# Patient Record
Sex: Female | Born: 1950 | Race: White | Hispanic: No | Smoking: Former smoker
Health system: Southern US, Community
[De-identification: ages and names within clinical notes are randomized; demographics above are authoritative.]

## PROBLEM LIST (undated history)

## (undated) DIAGNOSIS — S322XXA Fracture of coccyx, initial encounter for closed fracture: Secondary | ICD-10-CM

## (undated) DIAGNOSIS — F419 Anxiety disorder, unspecified: Secondary | ICD-10-CM

## (undated) DIAGNOSIS — IMO0002 Reserved for concepts with insufficient information to code with codable children: Secondary | ICD-10-CM

## (undated) DIAGNOSIS — J449 Chronic obstructive pulmonary disease, unspecified: Secondary | ICD-10-CM

## (undated) DIAGNOSIS — I10 Essential (primary) hypertension: Secondary | ICD-10-CM

## (undated) DIAGNOSIS — J45909 Unspecified asthma, uncomplicated: Secondary | ICD-10-CM

## (undated) DIAGNOSIS — G4730 Sleep apnea, unspecified: Secondary | ICD-10-CM

## (undated) DIAGNOSIS — E119 Type 2 diabetes mellitus without complications: Secondary | ICD-10-CM

## (undated) DIAGNOSIS — R0602 Shortness of breath: Secondary | ICD-10-CM

## (undated) HISTORY — PX: ABDOMINAL HYSTERECTOMY: SHX81

## (undated) HISTORY — PX: SKIN CANCER EXCISION: SHX779

---

## 2012-12-23 DIAGNOSIS — S322XXA Fracture of coccyx, initial encounter for closed fracture: Secondary | ICD-10-CM

## 2012-12-23 HISTORY — DX: Fracture of coccyx, initial encounter for closed fracture: S32.2XXA

## 2013-08-31 ENCOUNTER — Encounter (HOSPITAL_COMMUNITY): Payer: Self-pay | Admitting: Pharmacy Technician

## 2013-09-01 NOTE — H&P (Signed)
HISTORY AND PHYSICAL  LORRENA GORANSON is a 62 y.o. female patient with CC: Unable to eat and chew. Painful teeth  No diagnosis found.  No past medical history on file.  No current facility-administered medications for this encounter.   Current Outpatient Prescriptions  Medication Sig Dispense Refill  . amLODipine (NORVASC) 10 MG tablet Take 10 mg by mouth daily.      Marland Kitchen aspirin EC 81 MG tablet Take 81 mg by mouth daily.      Marland Kitchen gabapentin (NEURONTIN) 600 MG tablet Take 600-1,200 mg by mouth 3 (three) times daily. 600 mg every morning and midday, then 1200 mg at night      . hydrochlorothiazide (HYDRODIURIL) 25 MG tablet Take 25 mg by mouth daily.      . insulin glargine (LANTUS) 100 UNIT/ML injection Inject 78 Units into the skin at bedtime.      Marland Kitchen ketoconazole (NIZORAL) 2 % cream Apply 1 application topically daily as needed for irritation. Applied to face      . losartan (COZAAR) 50 MG tablet Take 50 mg by mouth daily.      . metFORMIN (GLUCOPHAGE) 1000 MG tablet Take 1,000 mg by mouth 2 (two) times daily with a meal.      . PARoxetine (PAXIL) 20 MG tablet Take 20 mg by mouth daily.      . pravastatin (PRAVACHOL) 40 MG tablet Take 40 mg by mouth daily.      Marland Kitchen tiotropium (SPIRIVA) 18 MCG inhalation capsule Place 18 mcg into inhaler and inhale daily.       Allergies  Allergen Reactions  . Codeine Hives  . Lactose Intolerance (Gi) Nausea Only  . Penicillins Hives   Active Problems:   * No active hospital problems. *  Vitals: There were no vitals taken for this visit. Lab results:No results found for this or any previous visit (from the past 24 hour(s)). Radiology Results: No results found. General appearance: alert, cooperative and morbidly obese Head: Normocephalic, without obvious abnormality, atraumatic Eyes: negative Nose: Nares normal. Septum midline. Mucosa normal. No drainage or sinus tenderness. Throat: dental caries @ 2, 6,11, 12, 19. 27, 29 Neck: no adenopathy,  supple, symmetrical, trachea midline and thyroid not enlarged, symmetric, no tenderness/mass/nodules Resp: clear to auscultation bilaterally Cardio: regular rate and rhythm, S1, S2 normal, no murmur, click, rub or gallop  Assessment: non-restorable teeth # 2, 6, 8, 9, 10, 11, 12, 19, 27, 29  Plan: dental extractions with alveoloplasty. General anesthesia. Day surgery.   Rahkeem Senft M 09/01/2013

## 2013-09-04 ENCOUNTER — Encounter (HOSPITAL_COMMUNITY)
Admission: RE | Admit: 2013-09-04 | Discharge: 2013-09-04 | Disposition: A | Discharge status: Home / Self Care | Payer: Medicaid Other | Source: Ambulatory Visit | Attending: Oral Surgery | Admitting: Oral Surgery

## 2013-09-04 ENCOUNTER — Encounter (HOSPITAL_COMMUNITY): Payer: Self-pay

## 2013-09-04 HISTORY — DX: Sleep apnea, unspecified: G47.30

## 2013-09-04 HISTORY — DX: Unspecified asthma, uncomplicated: J45.909

## 2013-09-04 HISTORY — DX: Essential (primary) hypertension: I10

## 2013-09-04 HISTORY — DX: Chronic obstructive pulmonary disease, unspecified: J44.9

## 2013-09-04 HISTORY — DX: Type 2 diabetes mellitus without complications: E11.9

## 2013-09-04 HISTORY — DX: Anxiety disorder, unspecified: F41.9

## 2013-09-04 HISTORY — DX: Fracture of coccyx, initial encounter for closed fracture: S32.2XXA

## 2013-09-04 HISTORY — DX: Reserved for concepts with insufficient information to code with codable children: IMO0002

## 2013-09-04 HISTORY — DX: Shortness of breath: R06.02

## 2013-09-04 LAB — CBC
HCT: 39.6 % (ref 36.0–46.0)
Hemoglobin: 12.8 g/dL (ref 12.0–15.0)
MCH: 26.9 pg (ref 26.0–34.0)
Platelets: 283 10*3/uL (ref 150–400)
RBC: 4.75 MIL/uL (ref 3.87–5.11)

## 2013-09-04 LAB — BASIC METABOLIC PANEL
BUN: 11 mg/dL (ref 6–23)
CO2: 32 mEq/L (ref 19–32)
Chloride: 101 mEq/L (ref 96–112)
Creatinine, Ser: 0.59 mg/dL (ref 0.50–1.10)

## 2013-09-04 NOTE — Progress Notes (Addendum)
Pt. States morning blood sugars have been 60-70. Notified allison zelenek,pa, stated for pt to take 1/2 or less of her evening lantus dosage the night before surgery and to eat a snack prior to midnight. Information given to pt.  Requested ekg/echo/cxr/notes/sleep study (sleep study done 5 yrs ago) from Dr. Judie Grieve at [facility]. Pt. Denies cardiologist.

## 2013-09-04 NOTE — Pre-Procedure Instructions (Signed)
Sheena Marquez  09/04/2013   Your procedure is scheduled on:  Monday, Dec. 15  Report to [facility] Main Entrance "A" at 5:30 AM.  Call this number if you have problems the morning of surgery: [phone]   Remember:   Do not eat food or drink liquids after midnight.   Take these medicines the morning of surgery with A SIP OF WATER: norvasc, gabapentin, losartan, paxil, spiriva   Do not wear jewelry, make-up or nail polish.  Do not wear lotions, powders, or perfumes. You may wear deodorant.  Do not shave 48 hours prior to surgery. Men may shave face and neck.  Do not bring valuables to the hospital.  [facility] is not responsible  for any belongings or valuables.               Contacts, dentures or bridgework may not be worn into surgery.  Leave suitcase in the car. After surgery it may be brought to your room.  For patients admitted to the hospital, discharge time is determined by your treatment team.               Patients discharged the day of surgery will not be allowed to drive  home.  Name and phone number of your driver:  Special Instructions: Shower using CHG 2 nights before surgery and the night before surgery.  If you shower the day of surgery use CHG.  Use special wash - you have one bottle of CHG for all showers.  You should use approximately 1/3 of the bottle for each shower.   Please read over the following fact sheets that you were given: Pain Booklet, Coughing and Deep Breathing and Surgical Site Infection Prevention

## 2013-09-06 MED ORDER — CLINDAMYCIN PHOSPHATE 600 MG/50ML IV SOLN
600.0000 mg | Freq: Four times a day (QID) | INTRAVENOUS | Status: DC
  Administered 2013-09-07: 600 mg via INTRAVENOUS
  Filled 2013-09-06: qty 50

## 2013-09-07 ENCOUNTER — Ambulatory Visit (HOSPITAL_COMMUNITY)
Admission: RE | Admit: 2013-09-07 | Discharge: 2013-09-07 | Disposition: A | Discharge status: Home / Self Care | Payer: Medicaid Other | Source: Ambulatory Visit | Attending: Oral Surgery | Admitting: Oral Surgery

## 2013-09-07 ENCOUNTER — Encounter (HOSPITAL_COMMUNITY): Payer: Medicaid Other | Admitting: Vascular Surgery

## 2013-09-07 ENCOUNTER — Ambulatory Visit (HOSPITAL_COMMUNITY): Payer: Medicaid Other | Admitting: Anesthesiology

## 2013-09-07 ENCOUNTER — Encounter (HOSPITAL_COMMUNITY): Payer: Self-pay | Admitting: *Deleted

## 2013-09-07 ENCOUNTER — Ambulatory Visit (HOSPITAL_COMMUNITY): Payer: Medicaid Other

## 2013-09-07 ENCOUNTER — Encounter (HOSPITAL_COMMUNITY)
Admission: RE | Disposition: A | Discharge status: Home / Self Care | Payer: Self-pay | Source: Ambulatory Visit | Attending: Oral Surgery

## 2013-09-07 DIAGNOSIS — K0530 Chronic periodontitis, unspecified: Secondary | ICD-10-CM | POA: Insufficient documentation

## 2013-09-07 DIAGNOSIS — J449 Chronic obstructive pulmonary disease, unspecified: Secondary | ICD-10-CM | POA: Insufficient documentation

## 2013-09-07 DIAGNOSIS — E119 Type 2 diabetes mellitus without complications: Secondary | ICD-10-CM | POA: Insufficient documentation

## 2013-09-07 DIAGNOSIS — K029 Dental caries, unspecified: Secondary | ICD-10-CM

## 2013-09-07 DIAGNOSIS — J4489 Other specified chronic obstructive pulmonary disease: Secondary | ICD-10-CM | POA: Insufficient documentation

## 2013-09-07 DIAGNOSIS — Z87891 Personal history of nicotine dependence: Secondary | ICD-10-CM | POA: Insufficient documentation

## 2013-09-07 HISTORY — PX: MULTIPLE EXTRACTIONS WITH ALVEOLOPLASTY: SHX5342

## 2013-09-07 SURGERY — MULTIPLE EXTRACTION WITH ALVEOLOPLASTY
Anesthesia: General | Site: Mouth

## 2013-09-07 MED ORDER — OXYMETAZOLINE HCL 0.05 % NA SOLN
NASAL | Status: AC
  Filled 2013-09-07: qty 15

## 2013-09-07 MED ORDER — ONDANSETRON HCL 4 MG/2ML IJ SOLN
INTRAMUSCULAR | Status: DC | PRN
  Administered 2013-09-07: 4 mg via INTRAVENOUS

## 2013-09-07 MED ORDER — OXYCODONE HCL 5 MG/5ML PO SOLN: 5.0000 mg | Freq: Once | ORAL | Status: DC | PRN

## 2013-09-07 MED ORDER — LIDOCAINE-EPINEPHRINE 2 %-1:100000 IJ SOLN
INTRAMUSCULAR | Status: AC
  Filled 2013-09-07: qty 1

## 2013-09-07 MED ORDER — SUCCINYLCHOLINE CHLORIDE 20 MG/ML IJ SOLN
INTRAMUSCULAR | Status: DC | PRN
  Administered 2013-09-07: 100 mg via INTRAVENOUS

## 2013-09-07 MED ORDER — LIDOCAINE HCL (CARDIAC) 20 MG/ML IV SOLN
INTRAVENOUS | Status: DC | PRN
  Administered 2013-09-07: 80 mg via INTRAVENOUS

## 2013-09-07 MED ORDER — CLINDAMYCIN HCL 300 MG PO CAPS: 300.0000 mg | ORAL_CAPSULE | Freq: Three times a day (TID) | ORAL | Status: AC

## 2013-09-07 MED ORDER — OXYCODONE-ACETAMINOPHEN 5-325 MG PO TABS: 1.0000 | ORAL_TABLET | ORAL | Status: AC | PRN

## 2013-09-07 MED ORDER — FENTANYL CITRATE 0.05 MG/ML IJ SOLN
INTRAMUSCULAR | Status: DC | PRN
  Administered 2013-09-07: 100 ug via INTRAVENOUS

## 2013-09-07 MED ORDER — OXYCODONE HCL 5 MG PO TABS: 5.0000 mg | ORAL_TABLET | Freq: Once | ORAL | Status: DC | PRN

## 2013-09-07 MED ORDER — HYDROMORPHONE HCL PF 1 MG/ML IJ SOLN: 0.2500 mg | INTRAMUSCULAR | Status: DC | PRN

## 2013-09-07 MED ORDER — SODIUM CHLORIDE 0.9 % IR SOLN
Status: DC | PRN
  Administered 2013-09-07: 1000 mL

## 2013-09-07 MED ORDER — LACTATED RINGERS IV SOLN
INTRAVENOUS | Status: DC
  Administered 2013-09-07 (×2): via INTRAVENOUS

## 2013-09-07 MED ORDER — ALBUTEROL SULFATE HFA 108 (90 BASE) MCG/ACT IN AERS
INHALATION_SPRAY | RESPIRATORY_TRACT | Status: DC | PRN
  Administered 2013-09-07: 6 via RESPIRATORY_TRACT

## 2013-09-07 MED ORDER — OXYMETAZOLINE HCL 0.05 % NA SOLN
NASAL | Status: DC | PRN
  Administered 2013-09-07: 1 via NASAL

## 2013-09-07 MED ORDER — LIDOCAINE-EPINEPHRINE 2 %-1:100000 IJ SOLN
INTRAMUSCULAR | Status: DC | PRN
  Administered 2013-09-07: 15 mL via INTRADERMAL

## 2013-09-07 MED ORDER — PROPOFOL 10 MG/ML IV BOLUS
INTRAVENOUS | Status: DC | PRN
  Administered 2013-09-07: 150 mg via INTRAVENOUS

## 2013-09-07 SURGICAL SUPPLY — 27 items
BUR CROSS CUT FISSURE 1.6 (BURR) ×2 IMPLANT
BUR EGG ELITE 4.0 (BURR) ×2 IMPLANT
CANISTER SUCTION 2500CC (MISCELLANEOUS) ×2 IMPLANT
CLOTH BEACON ORANGE TIMEOUT ST (SAFETY) ×2 IMPLANT
COVER SURGICAL LIGHT HANDLE (MISCELLANEOUS) ×2 IMPLANT
CRADLE DONUT ADULT HEAD (MISCELLANEOUS) ×2 IMPLANT
DECANTER SPIKE VIAL GLASS SM (MISCELLANEOUS) ×2 IMPLANT
GAUZE PACKING FOLDED 2  STR (GAUZE/BANDAGES/DRESSINGS) ×1
GAUZE PACKING FOLDED 2 STR (GAUZE/BANDAGES/DRESSINGS) ×1 IMPLANT
GLOVE BIO SURGEON STRL SZ 6.5 (GLOVE) ×2 IMPLANT
GLOVE BIO SURGEON STRL SZ7.5 (GLOVE) ×2 IMPLANT
GLOVE BIOGEL PI IND STRL 7.0 (GLOVE) ×1 IMPLANT
GLOVE BIOGEL PI INDICATOR 7.0 (GLOVE) ×1
GOWN STRL NON-REIN LRG LVL3 (GOWN DISPOSABLE) ×2 IMPLANT
GOWN STRL REIN XL XLG (GOWN DISPOSABLE) ×2 IMPLANT
KIT BASIN OR (CUSTOM PROCEDURE TRAY) ×2 IMPLANT
KIT ROOM TURNOVER OR (KITS) ×2 IMPLANT
NEEDLE 22X1 1/2 (OR ONLY) (NEEDLE) ×2 IMPLANT
NS IRRIG 1000ML POUR BTL (IV SOLUTION) ×2 IMPLANT
PAD ARMBOARD 7.5X6 YLW CONV (MISCELLANEOUS) ×4 IMPLANT
SUT CHROMIC 3 0 PS 2 (SUTURE) ×4 IMPLANT
SYR CONTROL 10ML LL (SYRINGE) ×2 IMPLANT
TOWEL OR 17X26 10 PK STRL BLUE (TOWEL DISPOSABLE) ×2 IMPLANT
TRAY ENT MC OR (CUSTOM PROCEDURE TRAY) ×2 IMPLANT
TUBING IRRIGATION (MISCELLANEOUS) IMPLANT
WATER STERILE IRR 1000ML POUR (IV SOLUTION) IMPLANT
YANKAUER SUCT BULB TIP NO VENT (SUCTIONS) ×2 IMPLANT

## 2013-09-07 NOTE — Anesthesia Postprocedure Evaluation (Signed)
  Anesthesia Post-op Note  Patient: Sheena Marquez  Procedure(s) Performed: Procedure(s): MULTIPLE EXTRACION WITH ALVEOLOPLASTY (N/A)  Patient Location: PACU  Anesthesia Type:General  Level of Consciousness: awake and sedated  Airway and Oxygen Therapy: Patient Spontanous Breathing  Post-op Pain: mild  Post-op Assessment: Post-op Vital signs reviewed  Post-op Vital Signs: stable  Complications: No apparent anesthesia complications

## 2013-09-07 NOTE — Progress Notes (Signed)
Pt is anxious to go home. Sats 90-91% on room air.  Anesthesia aware, and agrees pt is stable for discharge.  Pt will use home O2 when she gets home.

## 2013-09-07 NOTE — Progress Notes (Signed)
Attempting to wean pt off O2. She uses O2 at home PRN, but did not bring her O2 tank with her today.

## 2013-09-07 NOTE — Preoperative (Signed)
Beta Blockers   Reason not to administer Beta Blockers:Not Applicable 

## 2013-09-07 NOTE — Anesthesia Preprocedure Evaluation (Signed)
Anesthesia Evaluation  Patient identified by MRN, date of birth, ID band Patient awake    Reviewed: Allergy & Precautions, H&P , NPO status , Patient's Chart, lab work & pertinent test results  Airway Mallampati: II  Neck ROM: Full    Dental  (+) Poor Dentition   Pulmonary shortness of breath, asthma , COPDformer smoker,  + rhonchi         Cardiovascular Rhythm:Regular Rate:Normal     Neuro/Psych    GI/Hepatic   Endo/Other  diabetes  Renal/GU      Musculoskeletal   Abdominal   Peds  Hematology   Anesthesia Other Findings   Reproductive/Obstetrics                           Anesthesia Physical Anesthesia Plan  ASA: III  Anesthesia Plan: General   Post-op Pain Management:    Induction: Intravenous  Airway Management Planned: Oral ETT  Additional Equipment:   Intra-op Plan:   Post-operative Plan: Extubation in OR  Informed Consent:   Dental advisory given  Plan Discussed with: CRNA and Surgeon  Anesthesia Plan Comments:         Anesthesia Quick Evaluation

## 2013-09-07 NOTE — Transfer of Care (Signed)
Immediate Anesthesia Transfer of Care Note  Patient: Sheena Marquez  Procedure(s) Performed: Procedure(s): MULTIPLE EXTRACION WITH ALVEOLOPLASTY (N/A)  Patient Location: PACU  Anesthesia Type:General  Level of Consciousness: awake and alert   Airway & Oxygen Therapy: Patient Spontanous Breathing and Patient connected to face mask oxygen  Post-op Assessment: Report given to PACU RN, Post -op Vital signs reviewed and stable and Patient moving all extremities X 4  Post vital signs: Reviewed and stable  Complications: No apparent anesthesia complications

## 2013-09-07 NOTE — Anesthesia Procedure Notes (Signed)
Procedure Name: Intubation Date/Time: 09/07/2013 8:59 AM Performed by: Reine Just Pre-anesthesia Checklist: Patient identified, Emergency Drugs available, Suction available, Patient being monitored and Timeout performed Patient Re-evaluated:Patient Re-evaluated prior to inductionOxygen Delivery Method: Circle system utilized and Simple face mask Preoxygenation: Pre-oxygenation with 100% oxygen Intubation Type: IV induction Ventilation: Mask ventilation without difficulty Laryngoscope Size: Mac and 3 Grade View: Grade II Nasal Tubes: Right Tube size: 7.0 mm Number of attempts: 1 Airway Equipment and Method: Patient positioned with wedge pillow Placement Confirmation: ETT inserted through vocal cords under direct vision,  positive ETCO2 and breath sounds checked- equal and bilateral Tube secured with: Tape Dental Injury: Teeth and Oropharynx as per pre-operative assessment

## 2013-09-07 NOTE — H&P (Signed)
H&P documentation  -History and Physical Reviewed  -Patient has been re-examined  -No change in the plan of care  Sheena Marquez M  

## 2013-09-07 NOTE — Op Note (Signed)
09/07/2013  9:27 AM  PATIENT:  Sheena Marquez  62 y.o. female  PRE-OPERATIVE DIAGNOSIS:  NONRESTORABLE TEETH # 2, 6, 8, 9, 10, 11, 12, 19, 27, 29 /PERIODONTITIS  POST-OPERATIVE DIAGNOSIS:  SAME  PROCEDURE:  Procedure(s): MULTIPLE EXTRACTION T EETH # 2, 6, 8, 9, 10, 11, 12, 19, 27, 29  SURGEON:  Surgeon(s): Georgia Lopes, DDS  ANESTHESIA:   local and general  EBL:  minimal  DRAINS: none   SPECIMEN:  No Specimen  COUNTS:  YES  PLAN OF CARE: Discharge to home after PACU  PATIENT DISPOSITION:  PACU - hemodynamically stable.   PROCEDURE DETAILS: Dictation #[id number]  Georgia Lopes, DMD 09/07/2013 9:27 AM

## 2013-09-08 ENCOUNTER — Encounter (HOSPITAL_COMMUNITY): Payer: Self-pay | Admitting: Oral Surgery

## 2013-09-09 NOTE — Op Note (Signed)
Sheena Marquez, Sheena Marquez               ACCOUNT NO.:  [id number]  MEDICAL RECORD NO.:  [id number]  LOCATION:                               FACILITY:  [facility]  PHYSICIAN:  Georgia Lopes, M.D.  DATE OF BIRTH:  Mar 30, 1951  DATE OF PROCEDURE:  09/07/2013 DATE OF DISCHARGE:  09/07/2013                              OPERATIVE REPORT   PREOPERATIVE DIAGNOSIS:  Nonrestorable teeth #2, 6, 8, 9, 10, 11, 12, 19, 27, 29, chronic generalized periodontitis.  POSTOPERATIVE DIAGNOSIS:  Nonrestorable teeth #2, 6, 8, 9, 10, 11, 12, 19, 27, 29, chronic generalized periodontitis.  PROCEDURE:  Extraction teeth #2, 6, 8, 9, 10, 11, 12, 19, 27, 29.  SURGEON:  Georgia Lopes, MD  ANESTHESIA:  General nasal intubation, Dr. Amedeo Plenty, attending.  DESCRIPTION OF PROCEDURE:  The patient was taken to the operating room, placed on the table in supine position.  General anesthesia was administered intravenously and a nasal endotracheal tube was placed and secured.  The eyes were protected and the patient was draped for surgery.  Time-out was performed.  The posterior pharynx was suctioned and a throat pack was placed.  A 2% lidocaine 1:100,000 epinephrine was infiltrated in the inferior alveolar block on the right and left side and buccal and palatal infiltration around the teeth to be removed, a total of 14 mL was utilized.  A Sweetheart retractor and bite block were placed inside the mouth and the left side was operated first.  A 15 blade was used to make an incision around tooth remnant of tooth #19 and the tooth was elevated with a periosteal elevator and removed from the mouth.  Incision was also made in the maxilla around teeth #12, 11, 10, 9, 8.  The periosteum was reflected from around these teeth and then the teeth were elevated with a 301 elevator and removed with the #150 dental forceps.  The socket was then curetted and irrigated and the bite block was then repositioned to the other side of the mouth and  a 15 blade was used to make an incision around teeth numbers 27, 29, 2, and 6.  The periosteum was reflected with a periosteal elevator.  The teeth were elevated with a 301 elevator and removed from the mouth with the Pre- Universal forceps in the maxilla and Ash forceps in the mandible.  Then the sockets were curetted and irrigated.  Previously fabricated upper denture was placed in the mouth, found to have good fit and no bony interferences were noted, the lower partial was placed in the mouth and it was found to have good fit as well.  Then, the denture and partial were removed.  The oral cavity was irrigated, suctioned, and throat pack was removed.  The denture and partial were then repositioned into the mouth and the patient was awakened, taken to the recovery room, breathing spontaneously in good condition.  ESTIMATED BLOOD LOSS:  Minimum.  COMPLICATIONS:  None.  SPECIMENS:  None.     Georgia Lopes, M.D.   ______________________________ Georgia Lopes, M.D.    SMJ/MEDQ  D:  09/07/2013  T:  09/08/2013  Job:  [id number]

## 2015-05-02 ENCOUNTER — Ambulatory Visit: Payer: Medicare Other | Attending: Orthopaedic Surgery | Admitting: Physical Therapy

## 2015-05-02 DIAGNOSIS — M25511 Pain in right shoulder: Secondary | ICD-10-CM | POA: Diagnosis not present

## 2015-05-02 DIAGNOSIS — M25611 Stiffness of right shoulder, not elsewhere classified: Secondary | ICD-10-CM | POA: Insufficient documentation

## 2015-05-02 NOTE — Therapy (Signed)
[facility] [address] [place], [place], [address] Phone: [phone]   Fax:  [phone]  Physical Therapy Evaluation  Patient Details  Name: Sheena Marquez MRN: [id number] Date of Birth: 1951-03-10 Referring Provider:  Andria Rhein, MD  Encounter Date: 05/02/2015      PT End of Session - 05/02/15 1632    PT Stop Time 0242   Activity Tolerance Patient tolerated treatment well   Behavior During Therapy [facility] for tasks assessed/performed      Past Medical History  Diagnosis Date  . Hypertension   . Anxiety     panic attacks  . COPD (chronic obstructive pulmonary disease)   . Shortness of breath   . Sleep apnea   . Diabetes mellitus without complication   . Asthma 5-6 yrs. ago    asthma attack 5-6 yrs. ago [facility] in ICU 2 weeks  . Bulging disc     pt. states she has 3 bulging disc  . Fracture of coccyx 12/2012    Past Surgical History  Procedure Laterality Date  . Abdominal hysterectomy    . Skin cancer excision      head, upper lip, right hand  . Multiple extractions with alveoloplasty N/A 09/07/2013    Procedure: MULTIPLE EXTRACION WITH ALVEOLOPLASTY;  Surgeon: Georgia Lopes, DDS;  Location: [facility] OR;  Service: Oral Surgery;  Laterality: N/A;    There were no vitals filed for this visit.  Visit Diagnosis:  Right shoulder pain - Plan: PT plan of care cert/re-cert  Shoulder stiffness, right - Plan: PT plan of care cert/re-cert      Subjective Assessment - 05/02/15 1512    Subjective Been moving my shoulder at home quite a bit and trying to do stuff around the house.   Patient Stated Goals Regain use of my right UE.   Currently in Pain? Yes   Pain Score 6    Pain Location Shoulder   Pain Orientation Right   Pain Descriptors / Indicators Aching   Pain Type --  Sub-acute.            [facility] PT Assessment - 05/02/15 0001    Assessment   Medical Diagnosis Closed fracture of humerus.   Onset Date/Surgical  Date --  04/04/15.   Hand Dominance Left   Next MD Visit --  06/07/15.   Precautions   Precautions --   Precaution Comments Per MD order:  Progressive right arm strengthening/ROM no more than 5#.   Restrictions   Weight Bearing Restrictions No   Balance Screen   Has the patient fallen in the past 6 months Yes   How many times? 1   Has the patient had a decrease in activity level because of a fear of falling?  No   Is the patient reluctant to leave their home because of a fear of falling?  No   Home [employer] information [employer] residence   Prior Function   Level of Independence Independent   Cognition   Overall Cognitive Status Within Functional Limits for tasks assessed   ROM / Strength   AROM / PROM / Strength PROM   PROM   Overall PROM Comments In supine the patient's right shoulder passive-assistive right shoulder flexion= 93 degrees and ER= 23 degrees with IR to abdomen.   Palpation   Palpation comment Tender to palpation over right middle deltoid region with some remaining ecchymosis observed.   Ambulation/Gait   Gait Comments Independent.  [facility] Adult PT Treatment/Exercise - 05-12-15 0001    Modalities   Modalities Electrical Stimulation   Electrical Stimulation   Electrical Stimulation Location Affected right shoulder region.   Electrical Stimulation Action 80-150 HZ x 15 minutes.   Electrical Stimulation Goals Pain                PT Education - 05/12/2015 1641    Education provided Yes   Person(s) Educated Patient   Methods Explanation;Demonstration;Handout   Comprehension Verbalized understanding;Returned demonstration          PT Short Term Goals - 05/12/15 1645    PT SHORT TERM GOAL #1   Title Ind wiht initial HEP.   Time 2   Period Weeks   Status New           PT Long Term Goals - 05-12-15 1645    PT LONG TERM GOAL #1   Title Ind with an advanced HEP.   Time 8   Period Weeks   Status New   PT  LONG TERM GOAL #2   Title Active right shoulder flexion to 145 degrees so the patient can easily reach overhead   Time 8   Period Weeks   Status New   PT LONG TERM GOAL #3   Title Active ER to 70 degrees+ to allow for easily donning/doffing of apparel   Time 8   Period Weeks   Status New   PT LONG TERM GOAL #4   Title Increase ROM so patient is able to reach behind back to L3.   Time 6   Period Weeks   Status New   PT LONG TERM GOAL #5   Title Increase right  shoulder strength to a solid 4+/5 to increase stability for performance of functional activities   Time 6   Period Weeks   Status New   Additional Long Term Goals   Additional Long Term Goals Yes   PT LONG TERM GOAL #6   Title Perform ADL's with pain not > 2-3/10.   Time 6   Period Weeks   Status New               Plan - 2015/05/12 1357    Clinical Impression Statement Patient tripped over feet on July 11th.  Patient braced self on right hand.  Could not move right arm for about 24 hours.  X-ray confirmed a humeral fracture.  Pain at rest is a 2/10 but when I move my shoulder and ranges between 5-8/10 with attempts of active right shoulder movement.   Pt will benefit from skilled therapeutic intervention in order to improve on the following deficits Pain;Decreased activity tolerance;Decreased range of motion   Rehab Potential Good   PT Frequency 2x / week   PT Duration 8 weeks   PT Treatment/Interventions ADLs/Self Care Home Management;Cryotherapy;Electrical Stimulation          G-Codes - 2015/05/12 1633    Functional Assessment Tool Used FOTO   Functional Limitation Mobility: Walking and moving around   Mobility: Walking and Moving Around Current Status [phone]) At least 80 percent but less than 100 percent impaired, limited or restricted   Mobility: Walking and Moving Around Goal Status [phone]) At least 20 percent but less than 40 percent impaired, limited or restricted       Problem List There are no  active problems to display for this patient.   APPLE[place], [place] MPT 12-May-2015, 4:56 PM  [facility] [address]  [address] [place], [place], [address] Phone: [phone]   Fax:  [phone]

## 2015-05-02 NOTE — Patient Instructions (Addendum)
Pendulum Pendulum Circular   Bend forward 90 at waist, leaning on table for support. Rock body in a circular pattern to move arm clockwise _15 to 20___ times then counterclockwise _15 to 20___ times. Do _3-6_ sessions per day.  Copyright  VHI. All rights reserved.    Patient also instructed in gentle cane stretch into right shoulder ER in supine.

## 2015-05-05 ENCOUNTER — Ambulatory Visit: Payer: Medicare Other | Admitting: Physical Therapy

## 2015-05-05 DIAGNOSIS — M25611 Stiffness of right shoulder, not elsewhere classified: Secondary | ICD-10-CM

## 2015-05-05 DIAGNOSIS — M25511 Pain in right shoulder: Secondary | ICD-10-CM | POA: Diagnosis not present

## 2015-05-05 NOTE — Patient Instructions (Signed)
Strengthening: Isometric Flexion  Using wall for resistance, press right fist into ball using light pressure. Hold __10__ seconds. Repeat __10_ times per set. Do ____ sets per session. Do _2___ sessions per day.  DON'T DO THIS ONE YET: SHOULDER: Abduction (Isometric)  Use wall as resistance. Press arm against pillow. Keep elbow straight. Hold _10_ seconds. 10___ reps per set, 2___ sets per day, ___ days per week  Extension (Isometric)  Place left bent elbow and back of arm against wall. Press elbow against wall. Hold __10__ seconds. Repeat _10___ times. Do __2__ sessions per day.  Internal Rotation (Isometric)  Place palm of right fist against door frame, with elbow bent. Press fist against door frame. Hold _10___ seconds. Repeat __10__ times. Do __2__ sessions per day.  External Rotation (Isometric)  Place back of left fist against door frame, with elbow bent. Press fist against door frame. Hold __10__ seconds. Repeat _10___ times. Do ___2_ sessions per day.  Copyright  VHI. All rights reserved.   Abduction (Passive)   With arm out to side, resting on table, lower head toward arm, keeping trunk away from table. Hold _5-10___ seconds. Repeat _10___ times. Do _2-3___ sessions per day. Scoot chair away from table for greater ROM.  Copyright  VHI. All rights reserved.   Flexion (Passive)   Sitting upright, slide forearm forward along table, bending from the waist until a stretch is felt. Hold _5-10___ seconds. Repeat __10__ times. Do _2-3___ sessions per day.  Copyright  VHI. All rights reserved.  Solon Palm, PT 05/05/2015 1:44 PM [facility] [address] [place], [place], [address] Phone: [phone]   Fax:  [phone]

## 2015-05-05 NOTE — Therapy (Signed)
[facility] [address] [place], [place], [address] Phone: [phone]   Fax:  [phone]  Physical Therapy Treatment  Patient Details  Name: Sheena Marquez MRN: [id number] Date of Birth: [birth year] Referring Provider:  Andria Rhein, MD  Encounter Date: 05/05/2015      PT End of Session - 05/05/15 1302    Visit Number 2   Number of Visits 18   Date for PT Re-Evaluation 06/13/15   PT Start Time 1302   PT Stop Time 1400   PT Time Calculation (min) 58 min   Activity Tolerance Patient tolerated treatment well      Past Medical History  Diagnosis Date  . Hypertension   . Anxiety     panic attacks  . COPD (chronic obstructive pulmonary disease)   . Shortness of breath   . Sleep apnea   . Diabetes mellitus without complication   . Asthma 5-6 yrs. ago    asthma attack 5-6 yrs. ago [facility] in ICU 2 weeks  . Bulging disc     pt. states she has 3 bulging disc  . Fracture of coccyx 12/2012    Past Surgical History  Procedure Laterality Date  . Abdominal hysterectomy    . Skin cancer excision      head, upper lip, right hand  . Multiple extractions with alveoloplasty N/A 09/07/2013    Procedure: MULTIPLE EXTRACION WITH ALVEOLOPLASTY;  Surgeon: Georgia Lopes, DDS;  [facility];  Service: Oral Surgery;  Laterality: N/A;    There were no vitals filed for this visit.  Visit Diagnosis:  Shoulder stiffness, right  Right shoulder pain      Subjective Assessment - 05/05/15 1303    Subjective Patient has been doing HEP and it feels better. She reports cleaning house yesterday, so it's sore today.   Currently in Pain? Yes   Pain Score 7    Pain Location Shoulder   Pain Orientation Right   Pain Descriptors / Indicators Throbbing   Pain Onset 1 to 4 weeks ago                         [facility] Adult PT Treatment/Exercise - 05/05/15 0001    Exercises   Exercises Shoulder   Shoulder Exercises:  ROM/Strengthening   Other ROM/Strengthening Exercises cane for ER, supine chest press, flexion   Other ROM/Strengthening Exercises seated flexion and abduction   Shoulder Exercises: Isometric Strengthening   Flexion Other (comment)  10 second hold x 10   Extension --  10 sec hold x 10   External Rotation Other (comment)  10 sec x 10   Internal Rotation Other (comment)  10 sec x 10,   Internal Rotation Limitations pain so decreased intensity   ABduction Other (comment)  10 sec hold x 5   ABduction Limitations too painful so held at this time   Insurance claims handler Stimulation Location Affected right shoulder region.   Electrical Stimulation Action 80-150 Hz premod x 15 min   Electrical Stimulation Goals Pain                PT Education - 05/05/15 1547    Education provided Yes   Education Details HEP: shoulder isometrics, PROM   Person(s) Educated Patient   Methods Explanation;Demonstration;Verbal cues;Handout   Comprehension Verbalized understanding;Returned demonstration          PT Short Term Goals - 05/02/15 1645    PT SHORT TERM  GOAL #1   Title Ind wiht initial HEP.   Time 2   Period Weeks   Status New           PT Long Term Goals - 05/02/15 1645    PT LONG TERM GOAL #1   Title Ind with an advanced HEP.   Time 8   Period Weeks   Status New   PT LONG TERM GOAL #2   Title Active right shoulder flexion to 145 degrees so the patient can easily reach overhead   Time 8   Period Weeks   Status New   PT LONG TERM GOAL #3   Title Active ER to 70 degrees+ to allow for easily donning/doffing of apparel   Time 8   Period Weeks   Status New   PT LONG TERM GOAL #4   Title Increase ROM so patient is able to reach behind back to L3.   Time 6   Period Weeks   Status New   PT LONG TERM GOAL #5   Title Increase right  shoulder strength to a solid 4+/5 to increase stability for performance of functional activities   Time 6   Period Weeks    Status New   Additional Long Term Goals   Additional Long Term Goals Yes   PT LONG TERM GOAL #6   Title Perform ADL's with pain not > 2-3/10.   Time 6   Period Weeks   Status New               Plan - 05/05/15 1548    Clinical Impression Statement Patient tolerated ROM and isometric exercises well. She had increased pain with shoudler ABD and IR so was cued to decrease intensity which helped with IR. We held ABD at this time. Patient responded well to estim last visit so continued. She is anxious to return to using shoulder., particularly mowing lawn on rider. PT advised patient to not mow and to use pain as a guide with other activities.   PT Next Visit Plan Review PROM and isometric hep. Attempt pulleys. Manual ROM and STW prn.         Problem List There are no active problems to display for this patient.   Solon Palm PT  05/05/2015, 3:59 PM  [facility] [address] [place], [place], [address] Phone: [phone]   Fax:  [phone]

## 2015-05-10 ENCOUNTER — Encounter: Payer: Self-pay | Admitting: Physical Therapy

## 2015-05-10 ENCOUNTER — Ambulatory Visit: Payer: Medicare Other | Admitting: Physical Therapy

## 2015-05-10 DIAGNOSIS — M25511 Pain in right shoulder: Secondary | ICD-10-CM

## 2015-05-10 DIAGNOSIS — M25611 Stiffness of right shoulder, not elsewhere classified: Secondary | ICD-10-CM

## 2015-05-10 NOTE — Therapy (Signed)
[facility] [address] [place], [place], [address] Phone: [phone]   Fax:  [phone]  Physical Therapy Treatment  Patient Details  Name: Sheena Marquez MRN: [id number] Date of Birth: 1951/06/15 Referring Provider:  Andria Rhein, MD  Encounter Date: 05/10/2015      PT End of Session - 05/10/15 0949    Visit Number 3   Number of Visits 18   Date for PT Re-Evaluation 06/13/15   PT Start Time 0946   PT Stop Time 1036   PT Time Calculation (min) 50 min   Activity Tolerance Patient tolerated treatment well   Behavior During Therapy [facility] for tasks assessed/performed      Past Medical History  Diagnosis Date  . Hypertension   . Anxiety     panic attacks  . COPD (chronic obstructive pulmonary disease)   . Shortness of breath   . Sleep apnea   . Diabetes mellitus without complication   . Asthma 5-6 yrs. ago    asthma attack 5-6 yrs. ago [facility] in ICU 2 weeks  . Bulging disc     pt. states she has 3 bulging disc  . Fracture of coccyx 12/2012    Past Surgical History  Procedure Laterality Date  . Abdominal hysterectomy    . Skin cancer excision      head, upper lip, right hand  . Multiple extractions with alveoloplasty N/A 09/07/2013    Procedure: MULTIPLE EXTRACION WITH ALVEOLOPLASTY;  Surgeon: Georgia Lopes, DDS;  [facility] OR;  Service: Oral Surgery;  Laterality: N/A;    There were no vitals filed for this visit.  Visit Diagnosis:  Shoulder stiffness, right  Right shoulder pain      Subjective Assessment - 05/10/15 0948    Subjective Reports increased pain today and yesterday with more soreness. Reports feeling the pain more in shoulder joint region and intermittant pain into neck. States that she has been carrying gallons of water around the yard out of habit to the garden and stated "2 gallons." She states that she would realize she was using her hurt arm and would sit the bucket down or switch to LUE.  "The front of my R arm looks cottage cheesy"   Patient Stated Goals Regain use of my right UE.   Currently in Pain? Yes   Pain Score 7    Pain Location Shoulder   Pain Orientation Right   Pain Descriptors / Indicators Throbbing   Pain Onset 1 to 4 weeks ago   Pain Frequency Constant            [facility] PT Assessment - 05/10/15 0001    Assessment   Medical Diagnosis Closed fracture of humerus.   Onset Date/Surgical Date 04/04/15   Hand Dominance Left   Next MD Visit 06/07/2015   Precautions   Precaution Comments Per MD order:  Progressive right arm strengthening/ROM no more than 5#.                     [facility] Adult PT Treatment/Exercise - 05/10/15 0001    Exercises   Exercises Shoulder   Shoulder Exercises: Supine   Flexion AAROM;20 reps   Other Supine Exercises AAROM chest press x20 reps   Shoulder Exercises: Seated   External Rotation AAROM;Right;20 reps   Flexion 20 reps;Other (comment)  Table slide into flexion with pillow case and high plinth    Shoulder Exercises: Standing   External Rotation Strengthening;Right;10 reps;Other (comment)  Isometric 10  sec hold    Internal Rotation Strengthening;Right;10 reps;Other (comment)  Isometric 10 sec hold   Flexion Strengthening;Right;10 reps;Other (comment)  Isometric 10 sec hold   Flexion Limitations Reported feeling "electric charge" in R Bicep region   Extension Strengthening;Right;10 reps;Other (comment)  Isometric 10 sec hold   Shoulder Exercises: Pulleys   Flexion 3 minutes   Modalities   Modalities Electrical Stimulation   Electrical Stimulation   Electrical Stimulation Location R shoulder   Electrical Stimulation Action PRe-Mod   Electrical Stimulation Parameters 80-150 Hz x15 min   Electrical Stimulation Goals Pain                  PT Short Term Goals - 05/02/15 1645    PT SHORT TERM GOAL #1   Title Ind wiht initial HEP.   Time 2   Period Weeks   Status New           PT Long  Term Goals - 05/02/15 1645    PT LONG TERM GOAL #1   Title Ind with an advanced HEP.   Time 8   Period Weeks   Status New   PT LONG TERM GOAL #2   Title Active right shoulder flexion to 145 degrees so the patient can easily reach overhead   Time 8   Period Weeks   Status New   PT LONG TERM GOAL #3   Title Active ER to 70 degrees+ to allow for easily donning/doffing of apparel   Time 8   Period Weeks   Status New   PT LONG TERM GOAL #4   Title Increase ROM so patient is able to reach behind back to L3.   Time 6   Period Weeks   Status New   PT LONG TERM GOAL #5   Title Increase right  shoulder strength to a solid 4+/5 to increase stability for performance of functional activities   Time 6   Period Weeks   Status New   Additional Long Term Goals   Additional Long Term Goals Yes   PT LONG TERM GOAL #6   Title Perform ADL's with pain not > 2-3/10.   Time 6   Period Weeks   Status New               Plan - 05/10/15 1022    Clinical Impression Statement The patient tolerated treatment fairly well today altough she had pain during treatment. Experienced shooting electrical feel during flexion isometric today. No other episodes of pain were verbalized during their completion. Instructed patient to try not to use RUE for picking up gallons of water to prevent re-injury. Normal modalites response noted following removal of the modalites. Experienced 5/10 soreness pain following treatment.   Pt will benefit from skilled therapeutic intervention in order to improve on the following deficits Pain;Decreased activity tolerance;Decreased range of motion   Rehab Potential Good   PT Frequency 2x / week   PT Duration 8 weeks   PT Treatment/Interventions ADLs/Self Care Home Management;Cryotherapy;Electrical Stimulation   PT Next Visit Plan Review PROM and isometric hep. Attempt pulleys. Manual ROM and STW prn.    Consulted and Agree with Plan of Care Patient        Problem  List There are no active problems to display for this patient.   Evelene Croon, PTA 05/10/2015, 10:39 AM  [facility] [address] [place], [place], [address] Phone: [phone]   Fax:  [phone]

## 2015-05-12 ENCOUNTER — Encounter: Payer: Self-pay | Admitting: Physical Therapy

## 2015-05-12 ENCOUNTER — Ambulatory Visit: Payer: Medicare Other | Admitting: Physical Therapy

## 2015-05-12 DIAGNOSIS — M25511 Pain in right shoulder: Secondary | ICD-10-CM | POA: Diagnosis not present

## 2015-05-12 DIAGNOSIS — M25611 Stiffness of right shoulder, not elsewhere classified: Secondary | ICD-10-CM

## 2015-05-12 NOTE — Patient Instructions (Signed)
ROM: External / Internal Rotation - Wand   Holding wand with left hand palm up, push out from body with other hand, palm down. Keep both elbows bent. When stretch is felt, hold _5___ seconds. Repeat to other side, leading with same hand. Keep elbows bent. Repeat __10__ times per set. Do __2-3__ sets per session. Do __2__ sessions per day.  http://orth.exer.us/748   Copyright  VHI. All rights reserved.  ROM: Flexion - Wand (Supine)   Lie on back holding wand. Raise arms over head.  Repeat __10__ times per set. Do _2-3___ sets per session. Do _2___ sessions per day.  http://orth.exer.us/928   Copyright  VHI. All rights reserved.   

## 2015-05-12 NOTE — Therapy (Signed)
[facility] [place], [place], [address] Phone: [phone]   Fax:  [phone]  Physical Therapy Treatment  Patient Details  Name: Sheena Marquez MRN: [id number] Date of Birth: Feb 06, 1951 Referring Provider:  Abbe Amsterdam, MD  Encounter Date: 05/12/2015      PT End of Session - 05/12/15 1310    Visit Number 4   Number of Visits 18   Date for PT Re-Evaluation 06/13/15   PT Start Time 7494   PT Stop Time 1326   PT Time Calculation (min) 55 min   Activity Tolerance Patient tolerated treatment well   Behavior During Therapy [facility] for tasks assessed/performed      Past Medical History  Diagnosis Date  . Hypertension   . Anxiety     panic attacks  . COPD (chronic obstructive pulmonary disease)   . Shortness of breath   . Sleep apnea   . Diabetes mellitus without complication   . Asthma 5-6 yrs. ago    asthma attack 5-6 yrs. ago [facility] in ICU 2 weeks  . Bulging disc     pt. states she has 3 bulging disc  . Fracture of coccyx 12/2012    Past Surgical History  Procedure Laterality Date  . Abdominal hysterectomy    . Skin cancer excision      head, upper lip, right hand  . Multiple extractions with alveoloplasty N/A 09/07/2013    Procedure: MULTIPLE EXTRACION WITH ALVEOLOPLASTY;  Surgeon: Gae Bon, DDS;  Location: [facility];  Service: Oral Surgery;  Laterality: N/A;    There were no vitals filed for this visit.  Visit Diagnosis:  Shoulder stiffness, right  Right shoulder pain      Subjective Assessment - 05/12/15 1232    Subjective Sore today and continues to have difficulty with ADL's   Limitations House hold activities   Patient Stated Goals Regain use of my right UE.   Currently in Pain? Yes   Pain Score 5    Pain Location Shoulder   Pain Orientation Right   Pain Descriptors / Indicators Throbbing;Sharp   Pain Type Acute pain   Pain Onset More than a month ago   Pain Frequency Constant   Aggravating Factors  ADL's (doing hair, cooking and lifting)   Pain Relieving Factors rest            [facility] PT Assessment - 05/12/15 0001    ROM / Strength   AROM / PROM / Strength AROM;PROM   AROM   Overall AROM  Deficits   AROM Assessment Site Shoulder   Right/Left Shoulder Right   Right Shoulder Flexion 96 Degrees   Right Shoulder External Rotation 70 Degrees   PROM   Overall PROM  Deficits   PROM Assessment Site Shoulder   Right/Left Shoulder Right   Right Shoulder Flexion 120 Degrees   Right Shoulder External Rotation 78 Degrees                     [facility] Adult PT Treatment/Exercise - 05/12/15 0001    Shoulder Exercises: Supine   Other Supine Exercises AAROM cane for ROM flexion and ER 2x10 each   Shoulder Exercises: Seated   Other Seated Exercises cane 2x10 foe flexion and chest press to 90 degrees   Shoulder Exercises: Standing   Other Standing Exercises 4 way isometrics 10secx10 each   Shoulder Exercises: Pulleys   Flexion --  107mn   Other Pulley Exercises UE ranger foe elevation/circles  2x10 each   Acupuncturist Location R shoulder   Electrical Stimulation Action premod   Electrical Stimulation Parameters 1-10hz    Electrical Stimulation Goals Pain   Manual Therapy   Manual Therapy Passive ROM   Passive ROM gentle range for flexion /ER , then  rhythmic stabilization for IR/ER in scaption and flex/ext @90                 PT Education - 05/12/15 1254    Education Details HEP for ROM   Person(s) Educated Patient   Methods Explanation;Demonstration;Handout   Comprehension Verbalized understanding;Returned demonstration          PT Short Term Goals - 05/12/15 1311    PT SHORT TERM GOAL #1   Title Ind wiht initial HEP.   Time 2   Period Weeks   Status Achieved           PT Long Term Goals - 05/12/15 1311    PT LONG TERM GOAL #1   Title Ind with an advanced HEP.   Time 8   Period Weeks    Status On-going   PT LONG TERM GOAL #2   Title Active right shoulder flexion to 145 degrees so the patient can easily reach overhead   Time 8   Period Weeks   Status On-going   PT LONG TERM GOAL #3   Title Active ER to 70 degrees+ to allow for easily donning/doffing of apparel   Time 8   Period Weeks   Status On-going  able to reach 70 degrees actively yet difficulty with donn/doff apparel   PT LONG TERM GOAL #4   Title Increase ROM so patient is able to reach behind back to L3.   Time 6   Period Weeks   Status On-going   PT LONG TERM GOAL #5   Title Increase right  shoulder strength to a solid 4+/5 to increase stability for performance of functional activities   Time 6   Period Weeks   Status On-going   PT LONG TERM GOAL #6   Title Perform ADL's with pain not > 2-3/10.   Time 6   Period Weeks   Status On-going               Plan - 05/12/15 1312    Clinical Impression Statement Patient is progressing with all activities. patient has improved with ROM today passively and actively. Patient has reach within normal limits for ER yet continues to have difficulty with donning [place] apparel. pateint is independent with initial HEP and met STG #1 other LTG's ongoing due to pain, ROM, and strength deficits.   Pt will benefit from skilled therapeutic intervention in order to improve on the following deficits Pain;Decreased activity tolerance;Decreased range of motion   Rehab Potential Good   PT Frequency 2x / week   PT Duration 8 weeks   PT Treatment/Interventions ADLs/Self Care Home Management;Cryotherapy;Electrical Stimulation   PT Next Visit Plan cont with POC   Consulted and Agree with Plan of Care Patient        Problem List There are no active problems to display for this patient.   Denicia Pagliarulo, Campbell P, PTA 05/12/2015, 1:40 PM  [facility] [address] [place], [place], [address] Phone: [phone]   Fax:   [phone]

## 2015-05-17 ENCOUNTER — Encounter: Payer: Self-pay | Admitting: Physical Therapy

## 2015-05-17 ENCOUNTER — Ambulatory Visit: Payer: Medicare Other | Admitting: Physical Therapy

## 2015-05-17 DIAGNOSIS — M25611 Stiffness of right shoulder, not elsewhere classified: Secondary | ICD-10-CM

## 2015-05-17 DIAGNOSIS — M25511 Pain in right shoulder: Secondary | ICD-10-CM | POA: Diagnosis not present

## 2015-05-17 NOTE — Therapy (Signed)
[facility] [address] [place], [place], [address] Phone: [phone]   Fax:  [phone]  Physical Therapy Treatment  Patient Details  Name: SHAKYIA BOSSO MRN: [id number] Date of Birth: 12/02/1950 Referring Provider:  Andria Rhein, MD  Encounter Date: 05/17/2015      PT End of Session - 05/17/15 1028    Visit Number 5   Number of Visits 18   Date for PT Re-Evaluation 06/13/15   PT Start Time 0945   PT Stop Time 1044   PT Time Calculation (min) 59 min   Activity Tolerance Patient tolerated treatment well   Behavior During Therapy [facility] for tasks assessed/performed      Past Medical History  Diagnosis Date  . Hypertension   . Anxiety     panic attacks  . COPD (chronic obstructive pulmonary disease)   . Shortness of breath   . Sleep apnea   . Diabetes mellitus without complication   . Asthma 5-6 yrs. ago    asthma attack 5-6 yrs. ago [facility] in ICU 2 weeks  . Bulging disc     pt. states she has 3 bulging disc  . Fracture of coccyx 12/2012    Past Surgical History  Procedure Laterality Date  . Abdominal hysterectomy    . Skin cancer excision      head, upper lip, right hand  . Multiple extractions with alveoloplasty N/A 09/07/2013    Procedure: MULTIPLE EXTRACION WITH ALVEOLOPLASTY;  Surgeon: Georgia Lopes, DDS;  Location: [facility] OR;  Service: Oral Surgery;  Laterality: N/A;    There were no vitals filed for this visit.  Visit Diagnosis:  Shoulder stiffness, right  Right shoulder pain      Subjective Assessment - 05/17/15 0947    Subjective Patient reports less pain and range feels better.   Limitations House hold activities   Patient Stated Goals Regain use of my right UE.   Currently in Pain? Yes   Pain Score 3    Pain Location Shoulder   Pain Orientation Right   Pain Descriptors / Indicators Throbbing   Pain Type Acute pain   Pain Onset More than a month ago   Pain Frequency Intermittent   Aggravating  Factors  ADL's such as cooking, lifting and doing hair   Pain Relieving Factors rest            [facility] PT Assessment - 05/17/15 0001    ROM / Strength   AROM / PROM / Strength AROM;PROM   AROM   Overall AROM  Within functional limits for tasks performed;Deficits   AROM Assessment Site Shoulder   Right/Left Shoulder Right   Right Shoulder Flexion 98 Degrees  115 with elbow bent   Right Shoulder External Rotation 70 Degrees   PROM   Overall PROM  Deficits;Within functional limits for tasks performed   PROM Assessment Site Shoulder   Right/Left Shoulder Right   Right Shoulder Flexion 135 Degrees   Right Shoulder External Rotation 75 Degrees                     [facility] Adult PT Treatment/Exercise - 05/17/15 0001    Shoulder Exercises: Supine   Other Supine Exercises AAROM cane for ROM flexion and ER 2x10 each   Shoulder Exercises: Seated   Other Seated Exercises cane 2x10 for flexion and chest press to 90 degrees   Shoulder Exercises: Standing   Other Standing Exercises 4 way isometrics 10secx10 each   Shoulder  Exercises: Pulleys   Flexion --  5 min   Other Pulley Exercises UE ranger for elevation/circles 2x10 each   Insurance claims handler Stimulation Location R shoulder   Electrical Stimulation Action premod   Electrical Stimulation Parameters 1-10hz    Electrical Stimulation Goals Pain   Manual Therapy   Manual Therapy Passive ROM   Passive ROM gentle range for flexion /ER , then  rhythmic stabilization for IR/ER in scaption and flex/ext @90                   PT Short Term Goals - 05/12/15 1311    PT SHORT TERM GOAL #1   Title Ind wiht initial HEP.   Time 2   Period Weeks   Status Achieved           PT Long Term Goals - 05/12/15 1311    PT LONG TERM GOAL #1   Title Ind with an advanced HEP.   Time 8   Period Weeks   Status On-going   PT LONG TERM GOAL #2   Title Active right shoulder flexion to 145 degrees so the patient  can easily reach overhead   Time 8   Period Weeks   Status On-going   PT LONG TERM GOAL #3   Title Active ER to 70 degrees+ to allow for easily donning/doffing of apparel   Time 8   Period Weeks   Status On-going  able to reach 70 degrees actively yet difficulty with donn/doff apparel   PT LONG TERM GOAL #4   Title Increase ROM so patient is able to reach behind back to L3.   Time 6   Period Weeks   Status On-going   PT LONG TERM GOAL #5   Title Increase right  shoulder strength to a solid 4+/5 to increase stability for performance of functional activities   Time 6   Period Weeks   Status On-going   PT LONG TERM GOAL #6   Title Perform ADL's with pain not > 2-3/10.   Time 6   Period Weeks   Status On-going               Plan - 05/17/15 1029    Clinical Impression Statement Patient is progressing with all activities today. Patient has reported less frequent throbbing in right shoulder and able to do a little more at home with ADL's. Patient has also improved with ROM. Unale to meet any further goals due to pain, ROM and strength deficits.   Pt will benefit from skilled therapeutic intervention in order to improve on the following deficits Pain;Decreased activity tolerance;Decreased range of motion   Rehab Potential Good   PT Frequency 2x / week   PT Duration 8 weeks   PT Next Visit Plan cont with POC (MD. Andria Rhein 06/07/15)   Consulted and Agree with Plan of Care Patient        Problem List There are no active problems to display for this patient.   Hermelinda Dellen, PTA 05/17/2015, 10:48 AM  [facility] [address] [place], [place], [address] Phone: [phone]   Fax:  [phone]

## 2015-05-19 ENCOUNTER — Ambulatory Visit: Payer: Medicare Other | Admitting: Physical Therapy

## 2015-05-19 ENCOUNTER — Encounter: Payer: Self-pay | Admitting: Physical Therapy

## 2015-05-19 DIAGNOSIS — M25511 Pain in right shoulder: Secondary | ICD-10-CM

## 2015-05-19 DIAGNOSIS — M25611 Stiffness of right shoulder, not elsewhere classified: Secondary | ICD-10-CM

## 2015-05-19 NOTE — Therapy (Signed)
[facility] [address] [place], [place], [address] Phone: [phone]   Fax:  [phone]  Physical Therapy Treatment  Patient Details  Name: Sheena Marquez MRN: [id number] Date of Birth: [birth year] Referring Provider:  Andria Rhein, MD  Encounter Date: 05/19/2015      PT End of Session - 05/19/15 0905    Visit Number 6   Number of Visits 18   Date for PT Re-Evaluation 06/13/15   PT Start Time 0901   PT Stop Time 0951   PT Time Calculation (min) 50 min   Activity Tolerance Patient tolerated treatment well   Behavior During Therapy [facility] for tasks assessed/performed      Past Medical History  Diagnosis Date  . Hypertension   . Anxiety     panic attacks  . COPD (chronic obstructive pulmonary disease)   . Shortness of breath   . Sleep apnea   . Diabetes mellitus without complication   . Asthma 5-6 yrs. ago    asthma attack 5-6 yrs. ago [facility] in ICU 2 weeks  . Bulging disc     pt. states she has 3 bulging disc  . Fracture of coccyx 12/2012    Past Surgical History  Procedure Laterality Date  . Abdominal hysterectomy    . Skin cancer excision      head, upper lip, right hand  . Multiple extractions with alveoloplasty N/A 09/07/2013    Procedure: MULTIPLE EXTRACION WITH ALVEOLOPLASTY;  Surgeon: Georgia Lopes, DDS;  Location: [facility] OR;  Service: Oral Surgery;  Laterality: N/A;    There were no vitals filed for this visit.  Visit Diagnosis:  Shoulder stiffness, right  Right shoulder pain      Subjective Assessment - 05/19/15 0902    Subjective Patient reports pain around the anteriosuperior aspect of the R humerus and notes that it is still inflammed. Continues to report not being able to put on bra by herself. Cannot stand to lean on RUE on couch for more than a few minutes. Has been using clutch purse in R hand instead of pocketbook.   Limitations House hold activities   Patient Stated Goals Regain use of my right  UE.   Currently in Pain? Yes   Pain Score 7    Pain Location Shoulder   Pain Orientation Right;Anterior;Upper   Pain Descriptors / Indicators Sore   Pain Type Acute pain   Pain Onset More than a month ago            [facility] PT Assessment - 05/19/15 0001    Assessment   Medical Diagnosis Closed fracture of humerus.   Onset Date/Surgical Date 04/04/15   Next MD Visit 06/07/2015                     [facility] Adult PT Treatment/Exercise - 05/19/15 0001    Shoulder Exercises: Supine   Other Supine Exercises AAROM chest press x20 reps   Shoulder Exercises: Seated   External Rotation AAROM;20 reps   Flexion AAROM;20 reps   Shoulder Exercises: Standing   External Rotation Strengthening;Right;10 reps;Other (comment)  5 sec hold isometric   Internal Rotation Strengthening;Right;10 reps;Other (comment)  5 sec hold isometric   Flexion Strengthening;Right;10 reps;Other (comment)  5 sec hold isometric   Extension Strengthening;Right;10 reps;Other (comment)  5 sec hold isometric   Other Standing Exercises RUE wall ladder x3 min, RUE wall slides x20 reps   Shoulder Exercises: Pulleys   Flexion Other (comment)  x5  min   Modalities   Modalities Insurance account manager Location R shoulder   Electrical Stimulation Action Pre-Mod   Electrical Stimulation Parameters 80-150 Hz x15 min   Electrical Stimulation Goals Pain                PT Education - 05/19/15 [phone]    Education provided Yes   Education Details Educated patient to not push past the point pain during activity in efforts to not cause further injury.   Person(s) Educated Patient   Methods Explanation   Comprehension Verbalized understanding          PT Short Term Goals - 05/12/15 1311    PT SHORT TERM GOAL #1   Title Ind wiht initial HEP.   Time 2   Period Weeks   Status Achieved           PT Long Term Goals - 05/12/15 1311    PT LONG TERM  GOAL #1   Title Ind with an advanced HEP.   Time 8   Period Weeks   Status On-going   PT LONG TERM GOAL #2   Title Active right shoulder flexion to 145 degrees so the patient can easily reach overhead   Time 8   Period Weeks   Status On-going   PT LONG TERM GOAL #3   Title Active ER to 70 degrees+ to allow for easily donning/doffing of apparel   Time 8   Period Weeks   Status On-going  able to reach 70 degrees actively yet difficulty with donn/doff apparel   PT LONG TERM GOAL #4   Title Increase ROM so patient is able to reach behind back to L3.   Time 6   Period Weeks   Status On-going   PT LONG TERM GOAL #5   Title Increase right  shoulder strength to a solid 4+/5 to increase stability for performance of functional activities   Time 6   Period Weeks   Status On-going   PT LONG TERM GOAL #6   Title Perform ADL's with pain not > 2-3/10.   Time 6   Period Weeks   Status On-going               Plan - 05/19/15 0935    Clinical Impression Statement Patient continues to tolerate treatment well and complete exercises fairly well. Experienced RUE fatigue during RUE wall slides and pain with RUE extension isometric exercise and AAROM ER. Remaining goals are on-going at this time secondary to decreased ROM, strength, and increased pain. Normal modalites response noted following removal of the modalites. Experienced no pain while seated in chair but experienced 8/10 pain while elevating R shoulder following treatment.   Pt will benefit from skilled therapeutic intervention in order to improve on the following deficits Pain;Decreased activity tolerance;Decreased range of motion   Rehab Potential Good   PT Frequency 2x / week   PT Duration 8 weeks   PT Treatment/Interventions ADLs/Self Care Home Management;Cryotherapy;Electrical Stimulation   PT Next Visit Plan cont with POC (MD. Andria Rhein 06/07/15)   Consulted and Agree with Plan of Care Patient        Problem  List There are no active problems to display for this patient.   Evelene Croon, PTA 05/19/2015, 9:54 AM  [facility] [address] [place], [place], [address] Phone: [phone]   Fax:  [phone]

## 2015-05-24 ENCOUNTER — Ambulatory Visit: Payer: Medicare Other | Admitting: *Deleted

## 2015-05-24 ENCOUNTER — Encounter: Payer: Self-pay | Admitting: *Deleted

## 2015-05-24 DIAGNOSIS — M25511 Pain in right shoulder: Secondary | ICD-10-CM

## 2015-05-24 DIAGNOSIS — M25611 Stiffness of right shoulder, not elsewhere classified: Secondary | ICD-10-CM

## 2015-05-24 NOTE — Therapy (Signed)
[facility] [address] [place], [place], [address] Phone: [phone]   Fax:  [phone]  Physical Therapy Treatment  Patient Details  Name: Sheena Marquez MRN: [id number] Date of Birth: March 15, 1951 Referring Provider:  Andria Rhein, MD  Encounter Date: 05/24/2015      PT End of Session - 05/24/15 0919    Visit Number 7   Number of Visits 18   Date for PT Re-Evaluation 06/13/15   PT Start Time 0900   PT Stop Time 0952   PT Time Calculation (min) 52 min      Past Medical History  Diagnosis Date  . Hypertension   . Anxiety     panic attacks  . COPD (chronic obstructive pulmonary disease)   . Shortness of breath   . Sleep apnea   . Diabetes mellitus without complication   . Asthma 5-6 yrs. ago    asthma attack 5-6 yrs. ago [facility] in ICU 2 weeks  . Bulging disc     pt. states she has 3 bulging disc  . Fracture of coccyx 12/2012    Past Surgical History  Procedure Laterality Date  . Abdominal hysterectomy    . Skin cancer excision      head, upper lip, right hand  . Multiple extractions with alveoloplasty N/A 09/07/2013    Procedure: MULTIPLE EXTRACION WITH ALVEOLOPLASTY;  Surgeon: Georgia Lopes, DDS;  Location[facility] OR;  Service: Oral Surgery;  Laterality: N/A;    There were no vitals filed for this visit.  Visit Diagnosis:  Shoulder stiffness, right  Right shoulder pain      Subjective Assessment - 05/24/15 0917    Subjective Patient reports pain around the anteriosuperior aspect of the R humerus and notes that it is still inflammed. Continues to report not being able to put on bra by herself. Cannot stand to lean on RUE on couch for more than a few minutes. Has been using clutch purse in R hand instead of pocketbook.   Limitations House hold activities   Patient Stated Goals Regain use of my right UE.   Currently in Pain? Yes   Pain Score 9    Pain Location Shoulder   Pain Orientation Right   Pain  Descriptors / Indicators Sore   Pain Type Acute pain   Pain Onset More than a month ago   Pain Frequency Intermittent   Aggravating Factors  ADLs   Pain Relieving Factors rest                         [facility] Adult PT Treatment/Exercise - 05/24/15 0001    Exercises   Exercises Shoulder   Shoulder Exercises: Supine   Other Supine Exercises AAROM chest press x20 reps   Shoulder Exercises: Seated   External Rotation AAROM;20 reps   Flexion AAROM;20 reps   Other Seated Exercises cane 2x10 for flexion and chest press to 90 degrees   Shoulder Exercises: Standing   External Rotation Strengthening;Right;10 reps;Other (comment)  5 sec hold isometric   Internal Rotation Strengthening;Right;10 reps;Other (comment)  5 sec hold isometric   Flexion Strengthening;Right;10 reps;Other (comment)  5 sec hold isometric   Extension Strengthening;Right;10 reps;Other (comment)  5 sec hold isometric   Other Standing Exercises RUE wall ladder x3 min, RUE wall slides x20 reps   Shoulder Exercises: Pulleys   Flexion Other (comment)  x5 min   Electrical Stimulation   Electrical Stimulation Location RT shldr premod x 15  mins   Electrical Stimulation Goals Pain   Manual Therapy   Manual Therapy Passive ROM   Passive ROM gentle range for flexion /ER , then  rhythmic stabilization for IR/ER in scaption and flex/ext @90                   PT Short Term Goals - 05/12/15 1311    PT SHORT TERM GOAL #1   Title Ind wiht initial HEP.   Time 2   Period Weeks   Status Achieved           PT Long Term Goals - 05/12/15 1311    PT LONG TERM GOAL #1   Title Ind with an advanced HEP.   Time 8   Period Weeks   Status On-going   PT LONG TERM GOAL #2   Title Active right shoulder flexion to 145 degrees so the patient can easily reach overhead   Time 8   Period Weeks   Status On-going   PT LONG TERM GOAL #3   Title Active ER to 70 degrees+ to allow for easily donning/doffing of  apparel   Time 8   Period Weeks   Status On-going  able to reach 70 degrees actively yet difficulty with donn/doff apparel   PT LONG TERM GOAL #4   Title Increase ROM so patient is able to reach behind back to L3.   Time 6   Period Weeks   Status On-going   PT LONG TERM GOAL #5   Title Increase right  shoulder strength to a solid 4+/5 to increase stability for performance of functional activities   Time 6   Period Weeks   Status On-going   PT LONG TERM GOAL #6   Title Perform ADL's with pain not > 2-3/10.   Time 6   Period Weeks   Status On-going               Plan - 05/24/15 0902    Clinical Impression Statement Pt did fairly well today even though her pain level is high. She was able to tol. isometrics and Rhythmic stab. very well. Her most challenging motion is elevation due to weakness and pain.   Pt will benefit from skilled therapeutic intervention in order to improve on the following deficits Pain;Decreased activity tolerance;Decreased range of motion   Rehab Potential Good   PT Frequency 2x / week   PT Treatment/Interventions ADLs/Self Care Home Management;Cryotherapy;Electrical Stimulation   PT Next Visit Plan cont with POC (MD. Andria Rhein 06/07/15)   Consulted and Agree with Plan of Care Patient        Problem List There are no active problems to display for this patient.   RAMSEUR,CHRIS, PTA 05/24/2015, 10:04 AM  [facility] [address] [place], [place], [address] Phone: [phone]   Fax:  [phone]

## 2015-05-26 ENCOUNTER — Ambulatory Visit: Payer: Medicare Other | Attending: Orthopaedic Surgery | Admitting: *Deleted

## 2015-05-26 ENCOUNTER — Encounter: Payer: Self-pay | Admitting: *Deleted

## 2015-05-26 DIAGNOSIS — M25511 Pain in right shoulder: Secondary | ICD-10-CM | POA: Diagnosis present

## 2015-05-26 DIAGNOSIS — M25611 Stiffness of right shoulder, not elsewhere classified: Secondary | ICD-10-CM | POA: Insufficient documentation

## 2015-05-26 NOTE — Therapy (Signed)
[facility] [place], [place], [address] Phone: [phone]   Fax:  [phone]  Physical Therapy Treatment  Patient Details  Name: Sheena Marquez MRN: [id number] Date of Birth: 11-Jan-1951 Referring Provider:  Abbe Amsterdam, MD  Encounter Date: 05/26/2015      PT End of Session - 05/26/15 0919    Visit Number 8   Number of Visits 18   Date for PT Re-Evaluation 06/13/15   PT Start Time 0900   PT Stop Time 0950   PT Time Calculation (min) 50 min      Past Medical History  Diagnosis Date  . Hypertension   . Anxiety     panic attacks  . COPD (chronic obstructive pulmonary disease)   . Shortness of breath   . Sleep apnea   . Diabetes mellitus without complication   . Asthma 5-6 yrs. ago    asthma attack 5-6 yrs. ago [facility] in ICU 2 weeks  . Bulging disc     pt. states she has 3 bulging disc  . Fracture of coccyx 12/2012    Past Surgical History  Procedure Laterality Date  . Abdominal hysterectomy    . Skin cancer excision      head, upper lip, right hand  . Multiple extractions with alveoloplasty N/A 09/07/2013    Procedure: MULTIPLE EXTRACION WITH ALVEOLOPLASTY;  Surgeon: Gae Bon, DDS;  Location: [facility];  Service: Oral Surgery;  Laterality: N/A;    There were no vitals filed for this visit.  Visit Diagnosis:  Shoulder stiffness, right  Right shoulder pain      Subjective Assessment - 05/26/15 0922    Subjective Patient reports pain around the anteriosuperior aspect of the R humerus and notes that it is still inflammed. Continues to report not being able to put on bra by herself. Cannot stand to lean on RUE on couch for more than a few minutes. Has been using clutch purse in R hand instead of pocketbook.   Limitations House hold activities   Patient Stated Goals Regain use of my right UE.   Currently in Pain? Yes   Pain Score 7    Pain Location Shoulder   Pain Orientation Right   Pain  Descriptors / Indicators Sore   Pain Type Acute pain   Pain Onset More than a month ago   Pain Frequency Intermittent   Aggravating Factors  ADLs   Pain Relieving Factors rest            [facility] PT Assessment - 05/26/15 0001    PROM   PROM Assessment Site Shoulder   Right/Left Shoulder Right   Right Shoulder Flexion 145 Degrees   Right Shoulder Internal Rotation 80 Degrees   Right Shoulder External Rotation 75 Degrees                     [facility] Adult PT Treatment/Exercise - 05/26/15 0001    Exercises   Exercises Shoulder   Shoulder Exercises: Standing   Internal Rotation Strengthening;Right;10 reps;Other (comment)  5 sec hold isometric   Flexion Strengthening;Right;10 reps;Other (comment)  5 sec hold isometric   Extension Strengthening;Right;10 reps;Other (comment)  5 sec hold isometric   Other Standing Exercises RUE wall ladder x3 min, RUE wall slides x20 reps   Shoulder Exercises: Pulleys   Flexion Other (comment)  x5 min   Modalities   Modalities Electrical Stimulation   Electrical Stimulation   Electrical Stimulation Location RT shldr premod x  15 mins   Electrical Stimulation Goals Pain   Manual Therapy   Manual Therapy Passive ROM   Passive ROM gentle range for flexion IR /ER , then  rhythmic stabilization for IR/ER in scaption and flex/ext @90                   PT Short Term Goals - 05/12/15 1311    PT SHORT TERM GOAL #1   Title Ind wiht initial HEP.   Time 2   Period Weeks   Status Achieved           PT Long Term Goals - 05/12/15 1311    PT LONG TERM GOAL #1   Title Ind with an advanced HEP.   Time 8   Period Weeks   Status On-going   PT LONG TERM GOAL #2   Title Active right shoulder flexion to 145 degrees so the patient can easily reach overhead   Time 8   Period Weeks   Status On-going   PT LONG TERM GOAL #3   Title Active ER to 70 degrees+ to allow for easily donning/doffing of apparel   Time 8   Period Weeks    Status On-going  able to reach 70 degrees actively yet difficulty with donn/doff apparel   PT LONG TERM GOAL #4   Title Increase ROM so patient is able to reach behind back to L3.   Time 6   Period Weeks   Status On-going   PT LONG TERM GOAL #5   Title Increase right  shoulder strength to a solid 4+/5 to increase stability for performance of functional activities   Time 6   Period Weeks   Status On-going   PT LONG TERM GOAL #6   Title Perform ADL's with pain not > 2-3/10.   Time 6   Period Weeks   Status On-going               Plan - 05/26/15 0945    Clinical Impression Statement Pt did well today with lower pain levels. She had increased supine RT shldr ROM today. She still complaines of a "cathing or binding " feeling that the shldr gets in. This is noticeable during PROM/ AAROM.Current goals are ongoing. ER ROM goal is very close to beig met   Pt will benefit from skilled therapeutic intervention in order to improve on the following deficits Pain;Decreased activity tolerance;Decreased range of motion   Rehab Potential Good   PT Frequency 2x / week   PT Duration 8 weeks   PT Treatment/Interventions ADLs/Self Care Home Management;Cryotherapy;Electrical Stimulation   PT Next Visit Plan cont with POC (MD. Abbe Amsterdam 06/07/15)   Consulted and Agree with Plan of Care Patient        Problem List There are no active problems to display for this patient.   Andry Bogden,CHRIS, PTA 05/26/2015, 1:30 PM  [facility] [address] [place], [place], [address] Phone: [phone]   Fax:  [phone]

## 2015-06-01 ENCOUNTER — Ambulatory Visit: Payer: Medicare Other | Admitting: Physical Therapy

## 2015-06-01 DIAGNOSIS — M25611 Stiffness of right shoulder, not elsewhere classified: Secondary | ICD-10-CM | POA: Diagnosis not present

## 2015-06-01 DIAGNOSIS — M25511 Pain in right shoulder: Secondary | ICD-10-CM

## 2015-06-01 NOTE — Therapy (Signed)
[facility] [place], [place], [address] Phone: [phone]   Fax:  [phone]  Physical Therapy Treatment  Patient Details  Name: Sheena Marquez MRN: [id number] Date of Birth: 03-01-1951 Referring Provider:  Abbe Amsterdam, MD  Encounter Date: 06/01/2015      PT End of Session - 06/01/15 1133    Visit Number 9   Number of Visits 18   Date for PT Re-Evaluation 06/13/15   PT Start Time 0900   PT Stop Time 0951   PT Time Calculation (min) 51 min      Past Medical History  Diagnosis Date  . Hypertension   . Anxiety     panic attacks  . COPD (chronic obstructive pulmonary disease)   . Shortness of breath   . Sleep apnea   . Diabetes mellitus without complication   . Asthma 5-6 yrs. ago    asthma attack 5-6 yrs. ago [facility] in ICU 2 weeks  . Bulging disc     pt. states she has 3 bulging disc  . Fracture of coccyx 12/2012    Past Surgical History  Procedure Laterality Date  . Abdominal hysterectomy    . Skin cancer excision      head, upper lip, right hand  . Multiple extractions with alveoloplasty N/A 09/07/2013    Procedure: MULTIPLE EXTRACION WITH ALVEOLOPLASTY;  Surgeon: Gae Bon, DDS;  Location: [facility];  Service: Oral Surgery;  Laterality: N/A;    There were no vitals filed for this visit.  Visit Diagnosis:  Shoulder stiffness, right  Right shoulder pain      Subjective Assessment - 06/01/15 1125    Subjective I am very pleased with my progress.  I'm doing Tai Chi Again.   Limitations House hold activities   Patient Stated Goals Regain use of my right UE.                                   PT Short Term Goals - 05/12/15 1311    PT SHORT TERM GOAL #1   Title Ind wiht initial HEP.   Time 2   Period Weeks   Status Achieved           PT Long Term Goals - 05/12/15 1311    PT LONG TERM GOAL #1   Title Ind with an advanced HEP.   Time 8   Period Weeks   Status On-going   PT LONG TERM GOAL #2   Title Active right shoulder flexion to 145 degrees so the patient can easily reach overhead   Time 8   Period Weeks   Status On-going   PT LONG TERM GOAL #3   Title Active ER to 70 degrees+ to allow for easily donning/doffing of apparel   Time 8   Period Weeks   Status On-going  able to reach 70 degrees actively yet difficulty with donn/doff apparel   PT LONG TERM GOAL #4   Title Increase ROM so patient is able to reach behind back to L3.   Time 6   Period Weeks   Status On-going   PT LONG TERM GOAL #5   Title Increase right  shoulder strength to a solid 4+/5 to increase stability for performance of functional activities   Time 6   Period Weeks   Status On-going   PT LONG TERM GOAL #6   Title Perform ADL's with pain  not > 2-3/10.   Time 6   Period Weeks   Status On-going               Plan - 06/01/15 1131    PT Next Visit Plan FOTO G-code.    Treatment:  Pulleys x 8 minutes; 4-way right shoulder isometrics--multiple sets to near fatigue.  Supine cane exercises:  Flexion; bench press; Horizontal abd and adduction---multiple sets.   1-1 gentle manual PT/PROM--low load load duration holds and rhythmic stabs @ 90 degrees---circle CW and CCW.  Patient was also shown and performed a gentle towel stretch to increase behind the back movement.   Problem List There are no active problems to display for this patient.   Fernando Torry, [place] MPT 06/01/2015, 11:55 AM  [facility] [address] [place], [place], [address] Phone: [phone]   Fax:  [phone]

## 2015-06-09 ENCOUNTER — Encounter: Payer: Self-pay | Admitting: *Deleted

## 2015-06-09 ENCOUNTER — Ambulatory Visit: Payer: Medicare Other | Admitting: *Deleted

## 2015-06-09 DIAGNOSIS — M25611 Stiffness of right shoulder, not elsewhere classified: Secondary | ICD-10-CM | POA: Diagnosis not present

## 2015-06-09 DIAGNOSIS — M25511 Pain in right shoulder: Secondary | ICD-10-CM

## 2015-06-09 NOTE — Therapy (Signed)
[facility] [address] [place], [place], [address] Phone: [phone]   Fax:  [phone]  Physical Therapy Treatment  Patient Details  Name: Sheena Marquez MRN: [id number] Date of Birth: 07/12/1951 Referring Provider:  Andria Rhein, MD  Encounter Date: 06/09/2015      PT End of Session - 06/09/15 1006    Visit Number 10   Number of Visits 18   Date for PT Re-Evaluation 06/13/15   PT Start Time 0900   PT Stop Time 0950   PT Time Calculation (min) 50 min      Past Medical History  Diagnosis Date  . Hypertension   . Anxiety     panic attacks  . COPD (chronic obstructive pulmonary disease)   . Shortness of breath   . Sleep apnea   . Diabetes mellitus without complication   . Asthma 5-6 yrs. ago    asthma attack 5-6 yrs. ago [facility] in ICU 2 weeks  . Bulging disc     pt. states she has 3 bulging disc  . Fracture of coccyx 12/2012    Past Surgical History  Procedure Laterality Date  . Abdominal hysterectomy    . Skin cancer excision      head, upper lip, right hand  . Multiple extractions with alveoloplasty N/A 09/07/2013    Procedure: MULTIPLE EXTRACION WITH ALVEOLOPLASTY;  Surgeon: Georgia Lopes, DDS;  [facility]: [facility];  Service: Oral Surgery;  Laterality: N/A;    There were no vitals filed for this visit.  Visit Diagnosis:  Shoulder stiffness, right  Right shoulder pain                       [facility] Adult PT Treatment/Exercise - 06/09/15 0001    Exercises   Exercises Shoulder   Shoulder Exercises: Supine   External Rotation AROM;20 reps   Flexion AROM;Right;20 reps   Other Supine Exercises AAROM chest press x20 reps   Shoulder Exercises: Standing   External Rotation Strengthening;Right;10 reps;Other (comment)  5 sec hold isometric   Internal Rotation Strengthening;Right;10 reps;Other (comment)  5 sec hold isometric   Shoulder Exercises: Pulleys   Flexion Other (comment)  x5 min   Other Pulley Exercises UE ranger for elevation/circles 2x10 each   Modalities   Modalities Electrical Stimulation   Electrical Stimulation   Electrical Stimulation Location RT shldr premod x 15 mins   Electrical Stimulation Goals Pain   Manual Therapy   Manual Therapy Passive ROM   Passive ROM AAROM/ PROM range for flexion/ ABduction IR /ER , then  rhythmic stabilization for IR/ER in scaption and flex/ext @90                   PT Short Term Goals - 05/12/15 1311    PT SHORT TERM GOAL #1   Title Ind wiht initial HEP.   Time 2   Period Weeks   Status Achieved           PT Long Term Goals - 05/12/15 1311    PT LONG TERM GOAL #1   Title Ind with an advanced HEP.   Time 8   Period Weeks   Status On-going   PT LONG TERM GOAL #2   Title Active right shoulder flexion to 145 degrees so the patient can easily reach overhead   Time 8   Period Weeks   Status On-going   PT LONG TERM GOAL #3   Title Active ER to 70 degrees+  to allow for easily donning/doffing of apparel   Time 8   Period Weeks   Status On-going  able to reach 70 degrees actively yet difficulty with donn/doff apparel   PT LONG TERM GOAL #4   Title Increase ROM so patient is able to reach behind back to L3.   Time 6   Period Weeks   Status On-going   PT LONG TERM GOAL #5   Title Increase right  shoulder strength to a solid 4+/5 to increase stability for performance of functional activities   Time 6   Period Weeks   Status On-going   PT LONG TERM GOAL #6   Title Perform ADL's with pain not > 2-3/10.   Time 6   Period Weeks   Status On-going               Plan - 24-Jun-2015 1007    Clinical Impression Statement Pt did great today and is progressing towards all ROM. goals. Her ROM has improved in all motions except Abduction. She can only Abduct to 45-50 degrees and then has 7-8/10 pain. She did well with supine AROM Flexion to 158, ER to 70 degrees and  AAROM in Abduction to 130 degrees and HBB  to L5      Pt will benefit from skilled therapeutic intervention in order to improve on the following deficits Pain;Decreased activity tolerance;Decreased range of motion   Rehab Potential Good   PT Frequency 2x / week   PT Duration 8 weeks   PT Treatment/Interventions ADLs/Self Care Home Management;Cryotherapy;Electrical Stimulation   PT Next Visit Plan FOTO G-code. done   send MD note Medicare Recert   Consulted and Agree with Plan of Care Patient          G-Codes - 2015-06-24 1001    Functional Assessment Tool Used FOTO 10th visit 55% limited      Problem List There are no active problems to display for this patient.   Sonnet Rizor,CHRIS, PTA June 24, 2015, 10:13 AM  [facility] [address] [place], [place], [address] Phone: [phone]   Fax:  [phone]

## 2015-06-14 ENCOUNTER — Encounter: Payer: Self-pay | Admitting: *Deleted

## 2015-06-14 ENCOUNTER — Ambulatory Visit: Payer: Medicare Other | Admitting: *Deleted

## 2015-06-14 DIAGNOSIS — M25611 Stiffness of right shoulder, not elsewhere classified: Secondary | ICD-10-CM

## 2015-06-14 DIAGNOSIS — M25511 Pain in right shoulder: Secondary | ICD-10-CM

## 2015-06-14 NOTE — Therapy (Signed)
[facility] [address] [place], [place], [address] Phone: [phone]   Fax:  [phone]  Physical Therapy Treatment  Patient Details  Name: Sheena Marquez MRN: [id number] Date of Birth: [birth year] Referring Provider:  Andria Rhein, MD  Encounter Date: 06/14/2015      PT End of Session - 06/14/15 1200    Visit Number 11   Number of Visits 18   Date for PT Re-Evaluation 06/13/15   PT Start Time 1115   PT Stop Time 1205   PT Time Calculation (min) 50 min      Past Medical History  Diagnosis Date  . Hypertension   . Anxiety     panic attacks  . COPD (chronic obstructive pulmonary disease)   . Shortness of breath   . Sleep apnea   . Diabetes mellitus without complication   . Asthma 5-6 yrs. ago    asthma attack 5-6 yrs. ago [facility] in ICU 2 weeks  . Bulging disc     pt. states she has 3 bulging disc  . Fracture of coccyx 12/2012    Past Surgical History  Procedure Laterality Date  . Abdominal hysterectomy    . Skin cancer excision      head, upper lip, right hand  . Multiple extractions with alveoloplasty N/A 09/07/2013    Procedure: MULTIPLE EXTRACION WITH ALVEOLOPLASTY;  Surgeon: Georgia Lopes, DDS;  Location: [facility] OR;  Service: Oral Surgery;  Laterality: N/A;    There were no vitals filed for this visit.  Visit Diagnosis:  Shoulder stiffness, right  Right shoulder pain      Subjective Assessment - 06/14/15 1130    Subjective RT shldr is hurting more today. I did too much at home. I moved a lot of chairs and my shldr has been hurting   Limitations House hold activities   Patient Stated Goals Regain use of my right UE.   Currently in Pain? Yes   Pain Score 8    Pain Location Shoulder   Pain Orientation Right   Pain Descriptors / Indicators Sore   Pain Type Acute pain   Pain Onset More than a month ago   Pain Frequency Intermittent   Aggravating Factors  ADLs   Pain Relieving Factors rest                          [facility] Adult PT Treatment/Exercise - 06/14/15 0001    Exercises   Exercises Shoulder   Shoulder Exercises: Supine   Flexion AROM;Right;20 reps   Shoulder Exercises: Standing   External Rotation Strengthening;Right;10 reps;Other (comment)  5 sec hold isometric   Internal Rotation Strengthening;Right;10 reps;Other (comment)  5 sec hold isometric   Flexion Strengthening;Right;10 reps;Other (comment)  5 sec hold isometric   Extension Strengthening;Right;10 reps;Other (comment)  5 sec hold isometric   Other Standing Exercises RUE wall ladder x3 min, RUE wall slides x20 reps   Shoulder Exercises: Pulleys   Flexion Other (comment)  x5 min   Other Pulley Exercises UE ranger for elevation/circles 2x10 each   Modalities   Modalities Electrical Stimulation;Cryotherapy   Electrical Stimulation   Electrical Stimulation Location RT shldr premod x 15 mins   Electrical Stimulation Goals Pain   Manual Therapy   Manual Therapy Passive ROM   Passive ROM AAROM/ PROM range for flexion/ ABduction IR /ER ,                  PT  Short Term Goals - 05/12/15 1311    PT SHORT TERM GOAL #1   Title Ind wiht initial HEP.   Time 2   Period Weeks   Status Achieved           PT Long Term Goals - 05/12/15 1311    PT LONG TERM GOAL #1   Title Ind with an advanced HEP.   Time 8   Period Weeks   Status On-going   PT LONG TERM GOAL #2   Title Active right shoulder flexion to 145 degrees so the patient can easily reach overhead   Time 8   Period Weeks   Status On-going   PT LONG TERM GOAL #3   Title Active ER to 70 degrees+ to allow for easily donning/doffing of apparel   Time 8   Period Weeks   Status On-going  able to reach 70 degrees actively yet difficulty with donn/doff apparel   PT LONG TERM GOAL #4   Title Increase ROM so patient is able to reach behind back to L3.   Time 6   Period Weeks   Status On-going   PT LONG TERM GOAL #5   Title  Increase right  shoulder strength to a solid 4+/5 to increase stability for performance of functional activities   Time 6   Period Weeks   Status On-going   PT LONG TERM GOAL #6   Title Perform ADL's with pain not > 2-3/10.   Time 6   Period Weeks   Status On-going               Plan - 06/14/15 1203    Clinical Impression Statement Pt did fair today and was able to perform most of her normal exs, but had increased pain from using her arm more at home this weekend. She said her MD will do an MRI if needed if her  AROM for abduction doesn't improve. She also had a painful arch today with elevation   Pt will benefit from skilled therapeutic intervention in order to improve on the following deficits Pain;Decreased activity tolerance;Decreased range of motion   Rehab Potential Good   PT Frequency 2x / week   PT Duration 8 weeks   PT Treatment/Interventions ADLs/Self Care Home Management;Cryotherapy;Electrical Stimulation   PT Next Visit Plan Cont with exs as able   Consulted and Agree with Plan of Care Patient        Problem List There are no active problems to display for this patient.   RAMSEUR,CHRIS, PTA 06/14/2015, 12:11 PM  [facility] [address] [place], [place], [address] Phone: [phone]   Fax:  [phone]

## 2015-06-16 ENCOUNTER — Encounter: Payer: Medicare Other | Admitting: Physical Therapy

## 2015-06-17 ENCOUNTER — Ambulatory Visit: Payer: Medicare Other | Admitting: *Deleted

## 2015-06-17 ENCOUNTER — Encounter: Payer: Self-pay | Admitting: *Deleted

## 2015-06-17 DIAGNOSIS — M25611 Stiffness of right shoulder, not elsewhere classified: Secondary | ICD-10-CM

## 2015-06-17 DIAGNOSIS — M25511 Pain in right shoulder: Secondary | ICD-10-CM

## 2015-06-17 NOTE — Therapy (Signed)
[facility] [address] [place], [place], [address] Phone: [phone]   Fax:  [phone]  Physical Therapy Treatment  Patient Details  Name: Sheena Marquez MRN: [id number] Date of Birth: 1951-01-15 Referring Provider:  Andria Rhein, MD  Encounter Date: 06/17/2015      PT End of Session - 06/17/15 1219    Visit Number 12   Number of Visits 36   Date for PT Re-Evaluation 08/13/15   PT Start Time 1030   PT Stop Time 1129   PT Time Calculation (min) 59 min      Past Medical History  Diagnosis Date  . Hypertension   . Anxiety     panic attacks  . COPD (chronic obstructive pulmonary disease)   . Shortness of breath   . Sleep apnea   . Diabetes mellitus without complication   . Asthma 5-6 yrs. ago    asthma attack 5-6 yrs. ago [facility] in ICU 2 weeks  . Bulging disc     pt. states she has 3 bulging disc  . Fracture of coccyx 12/2012    Past Surgical History  Procedure Laterality Date  . Abdominal hysterectomy    . Skin cancer excision      head, upper lip, right hand  . Multiple extractions with alveoloplasty N/A 09/07/2013    Procedure: MULTIPLE EXTRACION WITH ALVEOLOPLASTY;  Surgeon: Georgia Lopes, DDS;  Location: [facility] OR;  Service: Oral Surgery;  Laterality: N/A;    There were no vitals filed for this visit.  Visit Diagnosis:  Shoulder stiffness, right  Right shoulder pain      Subjective Assessment - 06/17/15 1054    Subjective RT shldr is doing better with less pain. I was able to pick up my coffee cup today with less pain!   Limitations House hold activities   Patient Stated Goals Regain use of my right UE.   Currently in Pain? Yes   Pain Score 5    Pain Orientation Right   Pain Descriptors / Indicators Sore   Pain Type Acute pain   Pain Onset More than a month ago   Pain Frequency Intermittent   Aggravating Factors  ADLs   Pain Relieving Factors rest                         [facility]  Adult PT Treatment/Exercise - 06/17/15 0001    Exercises   Exercises Shoulder   Shoulder Exercises: Supine   Other Supine Exercises punches 2# 3x10,  circles3# 3x10 each way   Shoulder Exercises: Standing   External Rotation Strengthening;Right;10 reps;Other (comment)  5 sec hold isometric   Internal Rotation Strengthening;Right;10 reps;Other (comment)  5 sec hold isometric   Flexion Strengthening;Right;10 reps;Other (comment)  5 sec hold isometric   Extension Strengthening;Right;10 reps;Other (comment)  5 sec hold isometric   Other Standing Exercises bicep curl 3# 3x10   Shoulder Exercises: Pulleys   Flexion Other (comment)  x5 min   Other Pulley Exercises UE ranger for elevation/circles 2x10 each   Modalities   Modalities Electrical Stimulation;Cryotherapy   Cryotherapy   Number Minutes Cryotherapy 15 Minutes   Cryotherapy Location Shoulder   Type of Cryotherapy Ice pack   Electrical Stimulation   Electrical Stimulation Location RT shldr premod x 15 mins   Electrical Stimulation Goals Pain   Manual Therapy   Manual Therapy Passive ROM   Passive ROM AAROM/ PROM range for flexion/ ABduction IR /ER ,and Rhythmic stab  for IR/ER                  PT Short Term Goals - 05/12/15 1311    PT SHORT TERM GOAL #1   Title Ind wiht initial HEP.   Time 2   Period Weeks   Status Achieved           PT Long Term Goals - 05/12/15 1311    PT LONG TERM GOAL #1   Title Ind with an advanced HEP.   Time 8   Period Weeks   Status On-going   PT LONG TERM GOAL #2   Title Active right shoulder flexion to 145 degrees so the patient can easily reach overhead   Time 8   Period Weeks   Status On-going   PT LONG TERM GOAL #3   Title Active ER to 70 degrees+ to allow for easily donning/doffing of apparel   Time 8   Period Weeks   Status On-going  able to reach 70 degrees actively yet difficulty with donn/doff apparel   PT LONG TERM GOAL #4   Title Increase ROM so patient is  able to reach behind back to L3.   Time 6   Period Weeks   Status On-going   PT LONG TERM GOAL #5   Title Increase right  shoulder strength to a solid 4+/5 to increase stability for performance of functional activities   Time 6   Period Weeks   Status On-going   PT LONG TERM GOAL #6   Title Perform ADL's with pain not > 2-3/10.   Time 6   Period Weeks   Status On-going               Plan - 06/17/15 1227    Clinical Impression Statement PT did great today and was able to perform  the normal RT shldr exs and add some new ones.Her pain was lower today than last Rx, but she continues to have some pain with Abduction. She was able to pick up her coffee cup this morning and drink it with less pain. Goals are ongoing    Pt will benefit from skilled therapeutic intervention in order to improve on the following deficits Pain;Decreased activity tolerance;Decreased range of motion   Rehab Potential Good   PT Frequency 2x / week   PT Duration 8 weeks   PT Treatment/Interventions ADLs/Self Care Home Management;Cryotherapy;Electrical Stimulation   PT Next Visit Plan cont with AROM and light strengthening RT shldr. Abduction is still painful and weak   Consulted and Agree with Plan of Care Patient        Problem List There are no active problems to display for this patient.   RAMSEUR,CHRIS, PTA 06/17/2015, 12:36 PM  [facility] [address] [place], [place], [address] Phone: [phone]   Fax:  [phone]

## 2015-06-21 ENCOUNTER — Encounter: Payer: Self-pay | Admitting: *Deleted

## 2015-06-21 ENCOUNTER — Ambulatory Visit: Payer: Medicare Other | Admitting: *Deleted

## 2015-06-21 DIAGNOSIS — M25611 Stiffness of right shoulder, not elsewhere classified: Secondary | ICD-10-CM

## 2015-06-21 DIAGNOSIS — M25511 Pain in right shoulder: Secondary | ICD-10-CM

## 2015-06-21 NOTE — Therapy (Signed)
[facility] [place], [place], [address] Phone: [phone]   Fax:  [phone]  Physical Therapy Treatment  Patient Details  Name: Sheena Marquez MRN: [id number] Date of Birth: Aug 29, 1951 Referring Tomeka Kantner:  Abbe Amsterdam, MD  Encounter Date: 06/21/2015      PT End of Session - 06/21/15 0926    Visit Number 13   Number of Visits 36   Date for PT Re-Evaluation 08/13/15   PT Start Time 0900   PT Stop Time 0950   PT Time Calculation (min) 50 min      Past Medical History  Diagnosis Date  . Hypertension   . Anxiety     panic attacks  . COPD (chronic obstructive pulmonary disease)   . Shortness of breath   . Sleep apnea   . Diabetes mellitus without complication   . Asthma 5-6 yrs. ago    asthma attack 5-6 yrs. ago [facility] in ICU 2 weeks  . Bulging disc     pt. states she has 3 bulging disc  . Fracture of coccyx 12/2012    Past Surgical History  Procedure Laterality Date  . Abdominal hysterectomy    . Skin cancer excision      head, upper lip, right hand  . Multiple extractions with alveoloplasty N/A 09/07/2013    Procedure: MULTIPLE EXTRACION WITH ALVEOLOPLASTY;  Surgeon: Gae Bon, DDS;  Location: [facility];  Service: Oral Surgery;  Laterality: N/A;    There were no vitals filed for this visit.  Visit Diagnosis:  Shoulder stiffness, right  Right shoulder pain      Subjective Assessment - 06/21/15 0906    Subjective RT shldr has pain that runs down the upper part of my room. To MD 10-12 or 10-13.   Limitations House hold activities   Patient Stated Goals Regain use of my right UE.   Currently in Pain? Yes   Pain Score 5    Pain Location Shoulder   Pain Orientation Right   Pain Descriptors / Indicators Sore   Pain Type Acute pain   Pain Onset More than a month ago   Pain Frequency Intermittent   Aggravating Factors  ADLs   Pain Relieving Factors rest                          [facility] Adult PT Treatment/Exercise - 06/21/15 0001    Exercises   Exercises Shoulder   Shoulder Exercises: Supine   Other Supine Exercises punches 2# 3x10,  circles3# 3x10 each way   Shoulder Exercises: Standing   External Rotation Strengthening;Right;10 reps;Other (comment)  5 sec hold isometric   Internal Rotation Strengthening;Right;10 reps;Other (comment)  5 sec hold isometric   Flexion Strengthening;Right;10 reps;Other (comment)  5 sec hold isometric   Extension Strengthening;Right;10 reps;Other (comment)  5 sec hold isometric   Other Standing Exercises bicep curl 3# 3x10   Shoulder Exercises: Pulleys   Flexion Other (comment)  x5 min   Other Pulley Exercises UE ranger for elevation/circles 2x10 each   Modalities   Modalities Electrical Stimulation;Cryotherapy   Cryotherapy   Number Minutes Cryotherapy 15 Minutes   Cryotherapy Location Shoulder   Type of Cryotherapy Ice pack   Electrical Stimulation   Electrical Stimulation Location RT shldr premod x 15 mins   Electrical Stimulation Goals Pain   Manual Therapy   Manual Therapy Passive ROM   Passive ROM AAROM/ PROM range for flexion/ ABduction IR /ER ,and  Rhythmic stab for IR/ER                  PT Short Term Goals - 05/12/15 1311    PT SHORT TERM GOAL #1   Title Ind wiht initial HEP.   Time 2   Period Weeks   Status Achieved           PT Long Term Goals - 05/12/15 1311    PT LONG TERM GOAL #1   Title Ind with an advanced HEP.   Time 8   Period Weeks   Status On-going   PT LONG TERM GOAL #2   Title Active right shoulder flexion to 145 degrees so the patient can easily reach overhead   Time 8   Period Weeks   Status On-going   PT LONG TERM GOAL #3   Title Active ER to 70 degrees+ to allow for easily donning/doffing of apparel   Time 8   Period Weeks   Status On-going  able to reach 70 degrees actively yet difficulty with donn/doff apparel   PT LONG TERM GOAL #4   Title Increase ROM so  patient is able to reach behind back to L3.   Time 6   Period Weeks   Status On-going   PT LONG TERM GOAL #5   Title Increase right  shoulder strength to a solid 4+/5 to increase stability for performance of functional activities   Time 6   Period Weeks   Status On-going   PT LONG TERM GOAL #6   Title Perform ADL's with pain not > 2-3/10.   Time 6   Period Weeks   Status On-going               Plan - 06/21/15 1540    Clinical Impression Statement Pt did well with Rx today, but continues to have pain with abduction motion. She feels that she might be overdoing it at some times, but is trying to get back to doing her ADLs. No new goals were met today   Pt will benefit from skilled therapeutic intervention in order to improve on the following deficits Pain;Decreased activity tolerance;Decreased range of motion   Rehab Potential Good   PT Frequency 2x / week   PT Duration 8 weeks   PT Treatment/Interventions ADLs/Self Care Home Management;Cryotherapy;Electrical Stimulation   PT Next Visit Plan cont with AROM and light strengthening RT shldr. Abduction is still painful and weak   Consulted and Agree with Plan of Care Patient        Problem List There are no active problems to display for this patient.   RAMSEUR,CHRISPTA 06/21/2015, 10:41 AM  [facility] [address] [place], [place], [address] Phone: [phone]   Fax:  [phone]

## 2015-06-22 ENCOUNTER — Encounter: Payer: Medicare Other | Admitting: Physical Therapy

## 2015-06-27 ENCOUNTER — Ambulatory Visit: Payer: Medicare Other | Attending: Orthopaedic Surgery | Admitting: Physical Therapy

## 2015-06-27 ENCOUNTER — Encounter: Payer: Self-pay | Admitting: Physical Therapy

## 2015-06-27 DIAGNOSIS — M25611 Stiffness of right shoulder, not elsewhere classified: Secondary | ICD-10-CM | POA: Diagnosis present

## 2015-06-27 DIAGNOSIS — M25511 Pain in right shoulder: Secondary | ICD-10-CM

## 2015-06-27 NOTE — Therapy (Signed)
[facility] [address] [place], [place], [address] Phone: [phone]   Fax:  [phone]  Physical Therapy Treatment  Patient Details  Name: Sheena Marquez MRN: [id number] Date of Birth: 1950-10-25 Referring Provider:  Andria Rhein, MD  Encounter Date: 06/27/2015      PT End of Session - 06/27/15 1015    Visit Number 14   Number of Visits 36   Date for PT Re-Evaluation 08/13/15   PT Start Time 0944   PT Stop Time 1041   PT Time Calculation (min) 57 min   Activity Tolerance Patient tolerated treatment well   Behavior During Therapy [facility] for tasks assessed/performed      Past Medical History  Diagnosis Date  . Hypertension   . Anxiety     panic attacks  . COPD (chronic obstructive pulmonary disease) (HCC)   . Shortness of breath   . Sleep apnea   . Diabetes mellitus without complication (HCC)   . Asthma 5-6 yrs. ago    asthma attack 5-6 yrs. ago [facility] in ICU 2 weeks  . Bulging disc     pt. states she has 3 bulging disc  . Fracture of coccyx (HCC) 12/2012    Past Surgical History  Procedure Laterality Date  . Abdominal hysterectomy    . Skin cancer excision      head, upper lip, right hand  . Multiple extractions with alveoloplasty N/A 09/07/2013    Procedure: MULTIPLE EXTRACION WITH ALVEOLOPLASTY;  Surgeon: Georgia Lopes, DDS;  Location: [facility] OR;  Service: Oral Surgery;  Laterality: N/A;    There were no vitals filed for this visit.  Visit Diagnosis:  Shoulder stiffness, right  Right shoulder pain      Subjective Assessment - 06/27/15 0945    Subjective 6-7/10 pain in shoulder upon arrival   Limitations House hold activities   Patient Stated Goals Regain use of my right UE.   Currently in Pain? Yes   Pain Score 7    Pain Location Shoulder   Pain Descriptors / Indicators Sore   Pain Type Acute pain   Pain Onset More than a month ago   Aggravating Factors  ADL's   Pain Relieving Factors rest             [facility] PT Assessment - 06/27/15 0001    AROM   Overall AROM  Within functional limits for tasks performed;Deficits   AROM Assessment Site Shoulder   Right/Left Shoulder Right   Right Shoulder Flexion 92 Degrees   Right Shoulder External Rotation 70 Degrees   PROM   Overall PROM  Deficits;Within functional limits for tasks performed   PROM Assessment Site Shoulder   Right/Left Shoulder Right   Right Shoulder Flexion 145 Degrees   Right Shoulder External Rotation 75 Degrees                     [facility] Adult PT Treatment/Exercise - 06/27/15 0001    Shoulder Exercises: Supine   Other Supine Exercises punches 2# 3x10,  circles3# 3x10 each way   Other Supine Exercises cane for flexion 3x10   Shoulder Exercises: Standing   External Rotation Strengthening;Right  2x10   Theraband Level (Shoulder External Rotation) Level 1 (Yellow)  small half range   Internal Rotation Strengthening;Right  2x10   Theraband Level (Shoulder Internal Rotation) Level 1 (Yellow)   Other Standing Exercises bicep curl 3# 3x10   Shoulder Exercises: Pulleys   Flexion --   Other Pulley Exercises UE ranger for elevation/circles 2x10 each   Cryotherapy   Number Minutes Cryotherapy 15 Minutes   Cryotherapy Location Shoulder   Type of Cryotherapy Ice pack   Electrical Stimulation   Electrical Stimulation Location right shoulder   Electrical Stimulation Action premod   Electrical Stimulation Parameters 1-10hz    Electrical Stimulation Goals Pain                  PT Short Term Goals - 05/12/15 1311    PT SHORT TERM GOAL #1   Title Ind wiht initial HEP.   Time 2   Period Weeks   Status Achieved           PT Long Term Goals - 06/27/15 0950    PT LONG TERM GOAL #1   Title Ind with an advanced HEP.   Time 8   Period Weeks   Status On-going   PT LONG TERM GOAL #2   Title Active right shoulder flexion to 145 degrees so the patient can easily reach overhead    Time 8   Period Weeks   Status On-going  92 degrees on 06/27/15   PT LONG TERM GOAL #3   Title Active ER to 70 degrees+ to allow for easily donning/doffing of apparel   Time 8   Period Weeks   Status On-going   PT LONG TERM GOAL #4   Title Increase ROM so patient is able to reach behind back to L3.   Time 6   Period Weeks   Status On-going   PT LONG TERM GOAL #5   Title Increase right  shoulder strength to a solid 4+/5 to increase stability for performance of functional activities   Time 6   Period Weeks   Status On-going   PT LONG TERM GOAL #6   Title Perform ADL's with pain not > 2-3/10.   Time 6   Period Weeks   Status On-going               Plan - 06/27/15 1017    Clinical Impression Statement Patient continues to progress with all activities. Patient continues to have high pain level in right shoulder. Patient has improved AROM in right shoulder and has within normal limits passively. Patient is limited due to pain and strength. Patient  started IR/ER with yellow t-band half range per MPT today. Goals ongoing due to pain and strength deficits.   Pt will benefit from skilled therapeutic intervention in order to improve on the following deficits Pain;Decreased activity tolerance;Decreased range of motion   Rehab Potential Good   PT Frequency 2x / week   PT Duration 8 weeks   PT Treatment/Interventions ADLs/Self Care Home Management;Cryotherapy;Electrical Stimulation   PT Next Visit Plan cont with AROM and light strengthening RT shldr. Abduction is still painful and weak (MD Wiesler 07/21/15)   Consulted and Agree with Plan of Care Patient        Problem List There are no active problems to display for this patient.   Hermelinda Dellen, PTA 06/27/2015, 10:41 AM  [facility] [address] [place], [place], [address] Phone: [phone]   Fax:  [phone]

## 2015-06-29 ENCOUNTER — Encounter: Payer: Medicare Other | Admitting: Physical Therapy

## 2016-01-24 NOTE — Therapy (Signed)
[facility] [place], [place], [address] Phone: [phone]   Fax:  [phone]  Physical Therapy Treatment  Patient Details  Name: Sheena Marquez MRN: [id number] Date of Birth: May 02, 1951 No Data Recorded  Encounter Date: 06/27/2015    Past Medical History  Diagnosis Date  . Hypertension   . Anxiety     panic attacks  . COPD (chronic obstructive pulmonary disease) ([place])   . Shortness of breath   . Sleep apnea   . Diabetes mellitus without complication ([place])   . Asthma 5-6 yrs. ago    asthma attack 5-6 yrs. ago [facility] in ICU 2 weeks  . Bulging disc     pt. states she has 3 bulging disc  . Fracture of coccyx ([place]) 12/2012    Past Surgical History  Procedure Laterality Date  . Abdominal hysterectomy    . Skin cancer excision      head, upper lip, right hand  . Multiple extractions with alveoloplasty N/A 09/07/2013    Procedure: MULTIPLE EXTRACION WITH ALVEOLOPLASTY;  Surgeon: Gae Bon, DDS;  Location: [place];  Service: Oral Surgery;  Laterality: N/A;    There were no vitals filed for this visit.                                 PT Short Term Goals - 05/12/15 1311    PT SHORT TERM GOAL #1   Title Ind wiht initial HEP.   Time 2   Period Weeks   Status Achieved           PT Long Term Goals - 06/27/15 0950    PT LONG TERM GOAL #1   Title Ind with an advanced HEP.   Time 8   Period Weeks   Status On-going   PT LONG TERM GOAL #2   Title Active right shoulder flexion to 145 degrees so the patient can easily reach overhead   Time 8   Period Weeks   Status On-going  92 degrees on 06/27/15   PT LONG TERM GOAL #3   Title Active ER to 70 degrees+ to allow for easily donning/doffing of apparel   Time 8   Period Weeks   Status On-going   PT LONG TERM GOAL #4   Title Increase ROM so patient is able to reach behind back to L3.   Time 6   Period Weeks   Status On-going   PT LONG TERM GOAL #5   Title Increase right  shoulder strength to a solid 4+/5 to increase stability for performance of functional activities   Time 6   Period Weeks   Status On-going   PT LONG TERM GOAL #6   Title Perform ADL's with pain not > 2-3/10.   Time 6   Period Weeks   Status On-going             Patient will benefit from skilled therapeutic intervention in order to improve the following deficits and impairments:  Pain, Decreased activity tolerance, Decreased range of motion  Visit Diagnosis: Shoulder stiffness, right  Right shoulder pain     Problem List There are no active problems to display for this patient. PHYSICAL THERAPY DISCHARGE SUMMARY  Visits from Start of Care: 14  Current functional level related to goals / functional outcomes: Please see above.   Remaining deficits: Continued loss of shoulder ROM and strength.   Education /  Equipment: HEP.  Plan: Patient agrees to discharge.  Patient goals were not met. Patient is being discharged due to not returning since the last visit.  ?????       Margrette Wynia, [place] MPT 01/24/2016, 6:28 PM  [facility] [address] [place], [place], [address] Phone: [phone]   Fax:  [phone]  Name: JANEICE STEGALL MRN: [id number] Date of Birth: 05-05-1951

## 2016-04-02 ENCOUNTER — Ambulatory Visit: Payer: Medicare Other | Attending: Internal Medicine | Admitting: Physical Therapy

## 2016-04-02 DIAGNOSIS — M5441 Lumbago with sciatica, right side: Secondary | ICD-10-CM | POA: Diagnosis present

## 2016-04-02 DIAGNOSIS — M25611 Stiffness of right shoulder, not elsewhere classified: Secondary | ICD-10-CM | POA: Diagnosis present

## 2016-04-02 DIAGNOSIS — M25511 Pain in right shoulder: Secondary | ICD-10-CM | POA: Diagnosis present

## 2016-04-02 DIAGNOSIS — R293 Abnormal posture: Secondary | ICD-10-CM | POA: Diagnosis present

## 2016-04-02 NOTE — Therapy (Signed)
[facility] [address] [place], [place], [address] Phone: [phone]   Fax:  [phone]  Physical Therapy Evaluation  Patient Details  Name: Sheena Marquez MRN: [id number] Date of Birth: [birth year] Referring Provider: Geni Bers MD  Encounter Date: 04/02/2016      PT End of Session - 04/02/16 1731    Visit Number 1   Number of Visits 12   Date for PT Re-Evaluation 06/01/16   PT Start Time 0947   PT Stop Time 1037   PT Time Calculation (min) 50 min   Activity Tolerance Patient tolerated treatment well   Behavior During Therapy [facility] for tasks assessed/performed      Past Medical History  Diagnosis Date  . Hypertension   . Anxiety     panic attacks  . COPD (chronic obstructive pulmonary disease) (HCC)   . Shortness of breath   . Sleep apnea   . Diabetes mellitus without complication (HCC)   . Asthma 5-6 yrs. ago    asthma attack 5-6 yrs. ago [facility] in ICU 2 weeks  . Bulging disc     pt. states she has 3 bulging disc  . Fracture of coccyx (HCC) 12/2012    Past Surgical History  Procedure Laterality Date  . Abdominal hysterectomy    . Skin cancer excision      head, upper lip, right hand  . Multiple extractions with alveoloplasty N/A 09/07/2013    Procedure: MULTIPLE EXTRACION WITH ALVEOLOPLASTY;  Surgeon: Georgia Lopes, DDS;  Location[facility] OR;  Service: Oral Surgery;  Laterality: N/A;    There were no vitals filed for this visit.       Subjective Assessment - 04/02/16 1734    Subjective My back has been getting worse over the last 5 years.   Limitations Sitting;Standing;Walking   How long can you sit comfortably? 10 minutes.   How long can you stand comfortably? 10 minutes.   How long can you walk comfortably? Short distances.   Patient Stated Goals Get rid of my pain so i can have a better quality of life.   Currently in Pain? Yes   Pain Score 10-Worst pain ever   Pain Location Back   Pain Orientation  Right   Pain Descriptors / Indicators Aching;Tingling;Numbness   Pain Type Chronic pain   Pain Onset More than a month ago   Pain Frequency Constant   Aggravating Factors  "Everything."   Pain Relieving Factors "Nothing."            [facility] PT Assessment - 04/02/16 0001    Assessment   Medical Diagnosis Right lumbar radicular pain.   Referring Provider Geni Bers MD   Onset Date/Surgical Date --  5 years.   Precautions   Precautions None   Restrictions   Weight Bearing Restrictions No   Balance Screen   Has the patient fallen in the past 6 months Yes   How many times? --  2   Has the patient had a decrease in activity level because of a fear of falling?  Yes   Is the patient reluctant to leave their home because of a fear of falling?  Yes   Home Environment   Living Environment Private residence   Prior Function   Level of Independence Independent   Posture/Postural Control   Posture/Postural Control Postural limitations   Postural Limitations Decreased lumbar lordosis   ROM / Strength   AROM / PROM / Strength AROM;Strength   AROM  Overall AROM Comments Lumbar intervertebral movement into active flexion is limited by 40% and lumbar extension is limited to 12 degrees.   Strength   Overall Strength Comments Normal bilateral LE strength.   Palpation   Palpation comment Patient tender to palpation in area of right Piriformis region.   Special Tests    Special Tests --  (=)leg lengths;(-)SLR testing;(-)FABER test;absent(L)Ach DTR   Transfers   Comments Transitory movements are done slowly due to pain.   Ambulation/Gait   Gait Pattern --  Patient walks in some spinal flexion due to pain.                   [facility] Adult PT Treatment/Exercise - 04/02/16 0001    Modalities   Modalities Electrical Stimulation   Electrical Stimulation   Electrical Stimulation Location Right low back/right butock region.   Electrical Stimulation Action 80-150 Hz at 100% scan  x 20 minutes.   Electrical Stimulation Goals Pain                  PT Short Term Goals - 04/02/16 1746    PT SHORT TERM GOAL #1   Title Ind with initial HEP.   Time 2   Period Weeks   Status New           PT Long Term Goals - 04/02/16 1746    PT LONG TERM GOAL #1   Title Ind with an advanced HEP.   Time 8   Period Weeks   Status New   PT LONG TERM GOAL #2   Title Sit 30 minutes with pain not > 3-4/10.   Time 8   Period Weeks   Status On-going   PT LONG TERM GOAL #3   Title Stand 30 minutes with pain not > 3-4/10.   Time 8   Period Weeks   Status New   PT LONG TERM GOAL #4   Title Perform ADL's with pain not > 4/10.   Time 8   Period Weeks   Status New   PT LONG TERM GOAL #5   Title Eliminate right LE pain.   Time 8   Period Weeks   Status New               Plan - 04/02/16 1740    Clinical Impression Statement The patient presents to the clinic today with worsening low back pain over the last 5 years.  Her pain now goes into her right buttock, down her posterior thigh and to the lateral region of her right foot.  She is in a lot of pain today rating it at 10/10.  She demonstrates spinal hypomobility with regards to intervertebral movement.  She has not found anything to really reduce her pain significantly.   Rehab Potential Good   PT Frequency 2x / week   PT Duration 8 weeks   PT Treatment/Interventions ADLs/Self Care Home Management;Cryotherapy;Electrical Stimulation;Moist Heat;Therapeutic exercise;Therapeutic activities;Ultrasound;Traction;Patient/family education;Manual techniques   PT Next Visit Plan STW/M and PA mobs to lumbar region; right Piriformis release.  Core exercises (flexion biased); Int traction beginning at 30% body weight is an option should she not respond to aforementioned POC.   Consulted and Agree with Plan of Care Patient      Patient will benefit from skilled therapeutic intervention in order to improve the following  deficits and impairments:  Decreased activity tolerance, Pain, Decreased range of motion  Visit Diagnosis: Right-sided low back pain with right-sided sciatica - Plan: PT plan  of care cert/re-cert  Abnormal posture - Plan: PT plan of care cert/re-cert      G-Codes - 04/06/16 1732    Functional Assessment Tool Used FOTO...59% limitation.   Functional Limitation Mobility: Walking and moving around   Mobility: Walking and Moving Around Current Status [phone]) At least 40 percent but less than 60 percent impaired, limited or restricted   Mobility: Walking and Moving Around Goal Status [phone]) At least 20 percent but less than 40 percent impaired, limited or restricted       Problem List There are no active problems to display for this patient.   Kaila Devries, [place] MPT 2016/04/06, 6:11 PM  [facility] [address] [place], [place], [address] Phone: [phone]   Fax:  [phone]  Name: Sheena Marquez MRN: [id number] Date of Birth: 03-15-1951

## 2016-04-06 ENCOUNTER — Ambulatory Visit: Payer: Medicare Other | Admitting: Physical Therapy

## 2016-04-06 DIAGNOSIS — R293 Abnormal posture: Secondary | ICD-10-CM

## 2016-04-06 DIAGNOSIS — M5441 Lumbago with sciatica, right side: Secondary | ICD-10-CM

## 2016-04-06 NOTE — Therapy (Signed)
[facility] [address] [place], [place], [address] Phone: [phone]   Fax:  [phone]  Physical Therapy Treatment  Patient Details  Name: Sheena Marquez MRN: [id number] Date of Birth: 14-Mar-1951 Referring Provider: Geni BersKristy Barnes MD  Encounter Date: 04/06/2016      PT End of Session - 04/06/16 1215    Visit Number 2   Number of Visits 12   Date for PT Re-Evaluation 06/01/16   PT Start Time 0950   PT Stop Time 1041   PT Time Calculation (min) 51 min   Activity Tolerance Patient tolerated treatment well   Behavior During Therapy [facility] for tasks assessed/performed      Past Medical History  Diagnosis Date  . Hypertension   . Anxiety     panic attacks  . COPD (chronic obstructive pulmonary disease) (HCC)   . Shortness of breath   . Sleep apnea   . Diabetes mellitus without complication (HCC)   . Asthma 5-6 yrs. ago    asthma attack 5-6 yrs. ago [facility]  . Bulging disc     pt. states she has 3 bulging disc  . Fracture of coccyx (HCC) 12/2012    Past Surgical History  Procedure Laterality Date  . Abdominal hysterectomy    . Skin cancer excision      head, upper lip, right hand  . Multiple extractions with alveoloplasty N/A 09/07/2013    Procedure: MULTIPLE EXTRACION WITH ALVEOLOPLASTY;  Surgeon: Georgia LopesScott M Jensen, DDS;  [facility];  Service: Oral Surgery;  Laterality: N/A;    There were no vitals filed for this visit.      Subjective Assessment - 04/06/16 1215    Subjective No new complaints.   Pain Score 8    Pain Location Back   Pain Orientation Right   Pain Descriptors / Indicators Aching;Tingling;Numbness   Pain Type Chronic pain   Pain Onset More than a month ago      Treatment:  Left sdly position with 2 pillows between knees for comfort:  Combo E'stim/U/S at 1.50 W/CM2 x 12 minutes to patient's right low back/buttock region f/b STW/M x 14 minutes.  In supine HMP and IFC at 80-150 Hz x  20 minutes.  Patient tolerated treatment without complaint.                             PT Short Term Goals - 04/02/16 1746    PT SHORT TERM GOAL #1   Title Ind with initial HEP.   Time 2   Period Weeks   Status New           PT Long Term Goals - 04/02/16 1746    PT LONG TERM GOAL #1   Title Ind with an advanced HEP.   Time 8   Period Weeks   Status New   PT LONG TERM GOAL #2   Title Sit 30 minutes with pain not > 3-4/10.   Time 8   Period Weeks   Status On-going   PT LONG TERM GOAL #3   Title Stand 30 minutes with pain not > 3-4/10.   Time 8   Period Weeks   Status New   PT LONG TERM GOAL #4   Title Perform ADL's with pain not > 4/10.   Time 8   Period Weeks   Status New   PT LONG TERM GOAL #5   Title Eliminate  right LE pain.   Time 8   Period Weeks   Status New             Patient will benefit from skilled therapeutic intervention in order to improve the following deficits and impairments:  Decreased activity tolerance, Pain, Decreased range of motion  Visit Diagnosis: Right-sided low back pain with right-sided sciatica  Abnormal posture     Problem List There are no active problems to display for this patient.   Malikhi Ogan, [place] MPT 04/06/2016, 12:18 PM  [facility] [address] [place], [place], [address] Phone: [phone]   Fax:  [phone]  Name: Sheena Marquez MRN: [id number] Date of Birth: [birth year]

## 2016-04-10 ENCOUNTER — Encounter: Payer: Self-pay | Admitting: Physical Therapy

## 2016-04-10 ENCOUNTER — Ambulatory Visit: Payer: Medicare Other | Admitting: Physical Therapy

## 2016-04-10 DIAGNOSIS — M5441 Lumbago with sciatica, right side: Secondary | ICD-10-CM | POA: Diagnosis not present

## 2016-04-10 DIAGNOSIS — R293 Abnormal posture: Secondary | ICD-10-CM

## 2016-04-10 NOTE — Therapy (Signed)
[facility] [address] [place], [place], [address] Phone: [phone]   Fax:  [phone]  Physical Therapy Treatment  Patient Details  Name: Sheena Marquez MRN: [id number] Date of Birth: 04-29-1951 Referring Provider: Geni Bers MD  Encounter Date: 04/10/2016      PT End of Session - 04/10/16 1519    Visit Number 3   Number of Visits 12   Date for PT Re-Evaluation 06/01/16   PT Start Time 1518   PT Stop Time 1608   PT Time Calculation (min) 50 min   Activity Tolerance Patient tolerated treatment well   Behavior During Therapy [facility] for tasks assessed/performed      Past Medical History  Diagnosis Date  . Hypertension   . Anxiety     panic attacks  . COPD (chronic obstructive pulmonary disease) (HCC)   . Shortness of breath   . Sleep apnea   . Diabetes mellitus without complication (HCC)   . Asthma 5-6 yrs. ago    asthma attack 5-6 yrs. ago [facility] in ICU 2 weeks  . Bulging disc     pt. states she has 3 bulging disc  . Fracture of coccyx (HCC) 12/2012    Past Surgical History  Procedure Laterality Date  . Abdominal hysterectomy    . Skin cancer excision      head, upper lip, right hand  . Multiple extractions with alveoloplasty N/A 09/07/2013    Procedure: MULTIPLE EXTRACION WITH ALVEOLOPLASTY;  Surgeon: Georgia Lopes, DDS;  [facility] OR;  Service: Oral Surgery;  Laterality: N/A;    There were no vitals filed for this visit.      Subjective Assessment - 04/10/16 1517    Subjective Reports that today her leg and back pain is bad. Reports that she had severe pain while in the shower today. Took her mom to farmers market and not able to walk far and had to take pain pills and muscle relaxers.   Limitations Sitting;Standing;Walking   How long can you sit comfortably? 10 minutes.   How long can you stand comfortably? 10 minutes.   How long can you walk comfortably? Short distances.   Patient Stated Goals Get  rid of my pain so i can have a better quality of life.   Currently in Pain? Yes   Pain Score 10-Worst pain ever   Pain Location Leg   Pain Orientation Right   Pain Descriptors / Indicators Burning;Throbbing   Pain Radiating Towards down R hip to R calf area; "seems like it skips my thigh."   Pain Onset More than a month ago            [facility] PT Assessment - 04/10/16 0001    Assessment   Medical Diagnosis Right lumbar radicular pain.   Precautions   Precautions None   Restrictions   Weight Bearing Restrictions No                     [facility] Adult PT Treatment/Exercise - 04/10/16 0001    Modalities   Modalities Electrical Stimulation;Moist Heat;Ultrasound   Moist Heat Therapy   Number Minutes Moist Heat 15 Minutes   Moist Heat Location Lumbar Spine   Electrical Stimulation   Electrical Stimulation Location R low back/ hip   Electrical Stimulation Action Pre-Mod   Electrical Stimulation Parameters 80-150 hz x15 min   Electrical Stimulation Goals Pain   Ultrasound   Ultrasound Location R Glute/ Piriformis   Ultrasound Parameters 1.5  w/cm2, 100%,1 mhz x10 min   Ultrasound Goals Pain   Manual Therapy   Manual Therapy Myofascial release   Myofascial Release MFR/TPR to R Glute/Piriformis to decrease tightness and pain in L sidelying                PT Education - 04/10/16 1637    Education provided Yes   Education Details HEP- SKTC, Piriformis stretch   Person(s) Educated Patient   Methods Explanation;Demonstration;Verbal cues;Handout   Comprehension Verbalized understanding;Returned demonstration;Verbal cues required          PT Short Term Goals - 04/02/16 1746    PT SHORT TERM GOAL #1   Title Ind with initial HEP.   Time 2   Period Weeks   Status New           PT Long Term Goals - 04/02/16 1746    PT LONG TERM GOAL #1   Title Ind with an advanced HEP.   Time 8   Period Weeks   Status New   PT LONG TERM GOAL #2   Title Sit 30 minutes  with pain not > 3-4/10.   Time 8   Period Weeks   Status On-going   PT LONG TERM GOAL #3   Title Stand 30 minutes with pain not > 3-4/10.   Time 8   Period Weeks   Status New   PT LONG TERM GOAL #4   Title Perform ADL's with pain not > 4/10.   Time 8   Period Weeks   Status New   PT LONG TERM GOAL #5   Title Eliminate right LE pain.   Time 8   Period Weeks   Status New               Plan - 04/10/16 1643    Clinical Impression Statement Patient arrived to treatment with increased R low back pain that was radiating down to R calf area per patient report. Normal modalities response noted following removal of the modalities. Upon palpation of R Piriformis and Glute region that had tightness in both musculature and TPs noted just below area of R iliac crest that noted moderate release with TPR. Patient was educated regarding R hip/low back stretching regarding parameters with verbalized understanding.   Rehab Potential Good   PT Frequency 2x / week   PT Duration 8 weeks   PT Treatment/Interventions ADLs/Self Care Home Management;Cryotherapy;Electrical Stimulation;Moist Heat;Therapeutic exercise;Therapeutic activities;Ultrasound;Traction;Patient/family education;Manual techniques   PT Next Visit Plan STW/M and PA mobs to lumbar region; right Piriformis release.  Core exercises (flexion biased); Int traction beginning at 30% body weight is an option should she not respond to aforementioned POC.   PT Home Exercise Plan HEP- SKTC, Piriformis stretch   Consulted and Agree with Plan of Care Patient      Patient will benefit from skilled therapeutic intervention in order to improve the following deficits and impairments:  Decreased activity tolerance, Pain, Decreased range of motion  Visit Diagnosis: Right-sided low back pain with right-sided sciatica  Abnormal posture     Problem List There are no active problems to display for this patient.   Evelene CroonKelsey M Parsons,  PTA 04/10/2016, 4:48 PM  [facility] [address] [place], [place], [address] Phone: [phone]   Fax:  [phone]  Name: Sheena Marquez MRN: [id number] Date of Birth: 1951/02/02

## 2016-04-10 NOTE — Patient Instructions (Signed)
Knee-to-Chest Stretch: Unilateral    With hand behind right knee, pull knee in to chest until a comfortable stretch is felt in lower back and buttocks. Keep back relaxed. Hold __30__ seconds. Repeat __3__ times per set. Do ____ sets per session. Do _2-3___ sessions per day.  http://orth.exer.us/126   Copyright  VHI. All rights reserved.  Stretching: Piriformis    Cross left leg over other thigh and place elbow over outside of knee. Gently stretch buttock muscles by pushing bent knee across body. Hold _30___ seconds. Repeat _3___ times per set. Do ____ sets per session. Do __2-3__ sessions per day.  http://orth.exer.us/650   Copyright  VHI. All rights reserved.

## 2016-04-16 ENCOUNTER — Ambulatory Visit: Payer: Medicare Other | Admitting: Physical Therapy

## 2016-04-16 ENCOUNTER — Encounter: Payer: Self-pay | Admitting: Physical Therapy

## 2016-04-16 DIAGNOSIS — M5441 Lumbago with sciatica, right side: Secondary | ICD-10-CM

## 2016-04-16 DIAGNOSIS — M25611 Stiffness of right shoulder, not elsewhere classified: Secondary | ICD-10-CM

## 2016-04-16 DIAGNOSIS — M25511 Pain in right shoulder: Secondary | ICD-10-CM

## 2016-04-16 DIAGNOSIS — R293 Abnormal posture: Secondary | ICD-10-CM

## 2016-04-16 NOTE — Therapy (Signed)
[facility] [place], [place], [address] Phone: [phone]   Fax:  [phone]  Physical Therapy Treatment  Patient Details  Name: Sheena Marquez MRN: [id number] Date of Birth: 01-Dec-1950 Referring Provider: Fuller Plan MD  Encounter Date: 04/16/2016      PT End of Session - 04/16/16 1011    Visit Number 4   Number of Visits 12   Date for PT Re-Evaluation 06/01/16   PT Start Time 0946   PT Stop Time 0272   PT Time Calculation (min) 58 min      Past Medical History:  Diagnosis Date  . Anxiety    panic attacks  . Asthma 5-6 yrs. ago   asthma attack 5-6 yrs. ago [facility] in ICU 2 weeks  . Bulging disc    pt. states she has 3 bulging disc  . COPD (chronic obstructive pulmonary disease) ([place])   . Diabetes mellitus without complication ([place])   . Fracture of coccyx ([place]) 12/2012  . Hypertension   . Shortness of breath   . Sleep apnea     Past Surgical History:  Procedure Laterality Date  . ABDOMINAL HYSTERECTOMY    . MULTIPLE EXTRACTIONS WITH ALVEOLOPLASTY N/A 09/07/2013   Procedure: MULTIPLE EXTRACION WITH ALVEOLOPLASTY;  Surgeon: Gae Bon, DDS;  Location: [place];  Service: Oral Surgery;  Laterality: N/A;  . SKIN CANCER EXCISION     head, upper lip, right hand    There were no vitals filed for this visit.      Subjective Assessment - 04/16/16 0951    Subjective Patient reported feeling good after last treatment and some pain today   Limitations Sitting;Standing;Walking   How long can you sit comfortably? 10 minutes.   How long can you stand comfortably? 10 minutes.   How long can you walk comfortably? Short distances.   Patient Stated Goals Get rid of my pain so i can have a better quality of life.   Currently in Pain? Yes   Pain Score 7    Pain Location Back   Pain Orientation Right   Pain Descriptors / Indicators Burning;Aching;Throbbing   Pain Onset More than a month ago   Pain Frequency  Constant   Aggravating Factors  standing and walking   Pain Relieving Factors at rest                         [facility] Adult PT Treatment/Exercise - 04/16/16 0001      Moist Heat Therapy   Number Minutes Moist Heat 15 Minutes   Moist Heat Location Lumbar Spine     Electrical Stimulation   Electrical Stimulation Location R low back/ hip   Electrical Stimulation Action premod   Electrical Stimulation Parameters 80-150hz    Electrical Stimulation Goals Pain     Ultrasound   Ultrasound Location right glute piriformis   Ultrasound Parameters 1.5w/cm2/50%/70mz x 17m   Ultrasound Goals Pain     Manual Therapy   Manual Therapy Myofascial release   Myofascial Release MFR/TPR to R Glute/Piriformis to decrease tightness and pain in L sidelying                  PT Short Term Goals - 04/16/16 1014      PT SHORT TERM GOAL #1   Title Ind with initial HEP.   Time 2   Period Weeks   Status Achieved           PT  Long Term Goals - 04/16/16 1035      PT LONG TERM GOAL #1   Title Ind with an advanced HEP.   Time 8   Period Weeks   Status On-going     PT LONG TERM GOAL #2   Title Sit 30 minutes with pain not > 3-4/10.   Time 8   Period Weeks   Status On-going     PT LONG TERM GOAL #3   Title Stand 30 minutes with pain not > 3-4/10.   Time 8   Period Weeks   Status On-going     PT LONG TERM GOAL #4   Title Perform ADL's with pain not > 4/10.   Time 8   Period Weeks   Status On-going     PT LONG TERM GOAL #5   Title Eliminate right LE pain.   Time 8   Period Weeks   Status On-going               Plan - 04/16/16 1015    Clinical Impression Statement Patient progressing with good response to treatment thus far. Patient reported doing HEP dailly with no dificulty. Patient c/o pain with standing or walking esp long distance. Patient has palpable pain in right piriformis and glute area today. Patirnt met STG #1 other LTG's ongoing due to  pain deficits.    Rehab Potential Good   PT Frequency 2x / week   PT Duration 8 weeks   PT Treatment/Interventions ADLs/Self Care Home Management;Cryotherapy;Electrical Stimulation;Moist Heat;Therapeutic exercise;Therapeutic activities;Ultrasound;Traction;Patient/family education;Manual techniques   PT Next Visit Plan STW/M and PA mobs to lumbar region; right Piriformis release.  Core exercises (flexion biased); Int traction beginning at 30% body weight is an option should she not respond to aforementioned POC.   Consulted and Agree with Plan of Care Patient      Patient will benefit from skilled therapeutic intervention in order to improve the following deficits and impairments:  Decreased activity tolerance, Pain, Decreased range of motion  Visit Diagnosis: Right-sided low back pain with right-sided sciatica  Abnormal posture  Shoulder stiffness, right  Right shoulder pain     Problem List There are no active problems to display for this patient.   Phillips Climes, PTA 04/16/2016, 10:46 AM  [facility] [place], [place], [address] Phone: [phone]   Fax:  [phone]  Name: Sheena Marquez MRN: [id number] Date of Birth: [birth year]

## 2016-04-19 ENCOUNTER — Ambulatory Visit: Payer: Medicare Other | Admitting: *Deleted

## 2016-04-19 DIAGNOSIS — M5441 Lumbago with sciatica, right side: Secondary | ICD-10-CM | POA: Diagnosis not present

## 2016-04-19 DIAGNOSIS — R293 Abnormal posture: Secondary | ICD-10-CM

## 2016-04-19 NOTE — Therapy (Signed)
[facility] [address] [place], [place], [address] Phone: [phone]   Fax:  [phone]  Physical Therapy Treatment  Patient Details  Name: Sheena Marquez MRN: [id number] Date of Birth: 12/24/1950 Referring Provider: Geni Bers MD  Encounter Date: 04/19/2016      PT End of Session - 04/19/16 1303    Visit Number 5   Number of Visits 12   Date for PT Re-Evaluation 06/01/16   PT Start Time 1300   PT Stop Time 1354   PT Time Calculation (min) 54 min      Past Medical History:  Diagnosis Date  . Anxiety    panic attacks  . Asthma 5-6 yrs. ago   asthma attack 5-6 yrs. ago [facility] in ICU 2 weeks  . Bulging disc    pt. states she has 3 bulging disc  . COPD (chronic obstructive pulmonary disease) (HCC)   . Diabetes mellitus without complication (HCC)   . Fracture of coccyx (HCC) 12/2012  . Hypertension   . Shortness of breath   . Sleep apnea     Past Surgical History:  Procedure Laterality Date  . ABDOMINAL HYSTERECTOMY    . MULTIPLE EXTRACTIONS WITH ALVEOLOPLASTY N/A 09/07/2013   Procedure: MULTIPLE EXTRACION WITH ALVEOLOPLASTY;  Surgeon: Georgia Lopes, DDS;  Location: [facility] OR;  Service: Oral Surgery;  Laterality: N/A;  . SKIN CANCER EXCISION     head, upper lip, right hand    There were no vitals filed for this visit.      Subjective Assessment - 04/19/16 1259    Subjective Patient reported feeling good after last treatment and some pain today   Limitations Sitting;Standing;Walking   How long can you sit comfortably? 10 minutes.   How long can you stand comfortably? 10 minutes.   How long can you walk comfortably? Short distances.   Patient Stated Goals Get rid of my pain so i can have a better quality of life.   Currently in Pain? Yes   Pain Score 7    Pain Location Back   Pain Orientation Right   Pain Descriptors / Indicators Burning;Aching;Throbbing   Pain Type Chronic pain   Pain Radiating Towards down  RT leg   Pain Onset More than a month ago                         [facility] Adult PT Treatment/Exercise - 04/19/16 0001      Modalities   Modalities Electrical Stimulation;Moist Heat;Ultrasound     Moist Heat Therapy   Number Minutes Moist Heat 15 Minutes   Moist Heat Location Lumbar Spine     Electrical Stimulation   Electrical Stimulation Location RT HIP   Electrical Stimulation Action premod   Electrical Stimulation Parameters 80-150hz  x 15 mins   Electrical Stimulation Goals Pain     Ultrasound   Ultrasound Location RT piriformis   Ultrasound Parameters 1.5 w/cm2 x 12 mins   Ultrasound Goals Pain     Manual Therapy   Manual Therapy Myofascial release   Myofascial Release MFR/TPR to R Glute/Piriformis to decrease tightness and pain in L sidelying                  PT Short Term Goals - 04/16/16 1014      PT SHORT TERM GOAL #1   Title Ind with initial HEP.   Time 2   Period Weeks   Status Achieved  PT Long Term Goals - 04/16/16 1035      PT LONG TERM GOAL #1   Title Ind with an advanced HEP.   Time 8   Period Weeks   Status On-going     PT LONG TERM GOAL #2   Title Sit 30 minutes with pain not > 3-4/10.   Time 8   Period Weeks   Status On-going     PT LONG TERM GOAL #3   Title Stand 30 minutes with pain not > 3-4/10.   Time 8   Period Weeks   Status On-going     PT LONG TERM GOAL #4   Title Perform ADL's with pain not > 4/10.   Time 8   Period Weeks   Status On-going     PT LONG TERM GOAL #5   Title Eliminate right LE pain.   Time 8   Period Weeks   Status On-going               Plan - 04/19/16 1304    Clinical Impression Statement Pt did fairly well today with RX with some relief of pain in RT hip/ buttocks. She was still tight and sore along RT piriformis area during STW. Functioinal goals are still ongoing due to pain .  Traction was discussed again if pain remains high.   Rehab Potential Good    PT Frequency 2x / week   PT Duration 8 weeks   PT Treatment/Interventions ADLs/Self Care Home Management;Cryotherapy;Electrical Stimulation;Moist Heat;Therapeutic exercise;Therapeutic activities;Ultrasound;Traction;Patient/family education;Manual techniques   PT Next Visit Plan STW/M and PA mobs to lumbar region; right Piriformis release.  Core exercises (flexion biased); Int traction beginning at 30% body weight is an option should she not respond to aforementioned POC.   PT Home Exercise Plan HEP- SKTC, Piriformis stretch   Consulted and Agree with Plan of Care Patient      Patient will benefit from skilled therapeutic intervention in order to improve the following deficits and impairments:  Decreased activity tolerance, Pain, Decreased range of motion  Visit Diagnosis: Right-sided low back pain with right-sided sciatica  Abnormal posture     Problem List There are no active problems to display for this patient.   Tonnya Garbett,CHRIS, PTA 04/19/2016, 2:13 PM  [facility] [address] [place], [place], [address] Phone: [phone]   Fax:  [phone]  Name: Sheena Marquez MRN: [id number] Date of Birth: 1951-06-04

## 2016-04-23 ENCOUNTER — Ambulatory Visit: Payer: Medicare Other | Admitting: Physical Therapy

## 2016-04-23 ENCOUNTER — Encounter: Payer: Self-pay | Admitting: Physical Therapy

## 2016-04-23 DIAGNOSIS — M5441 Lumbago with sciatica, right side: Secondary | ICD-10-CM | POA: Diagnosis not present

## 2016-04-23 DIAGNOSIS — R293 Abnormal posture: Secondary | ICD-10-CM

## 2016-04-23 NOTE — Therapy (Signed)
[facility] [address] [place], [place], [address] Phone: [phone]   Fax:  [phone]  Physical Therapy Treatment  Patient Details  Name: Sheena Marquez MRN: [id number] Date of Birth: [birth year] Referring Provider: Geni Bers MD  Encounter Date: 04/23/2016      PT End of Session - 04/23/16 1550    Visit Number 6   Number of Visits 12   Date for PT Re-Evaluation 06/01/16   PT Start Time 1520   PT Stop Time 1606   PT Time Calculation (min) 46 min   Activity Tolerance Patient tolerated treatment well   Behavior During Therapy [facility] for tasks assessed/performed      Past Medical History:  Diagnosis Date  . Anxiety    panic attacks  . Asthma 5-6 yrs. ago   asthma attack 5-6 yrs. ago [facility] in ICU 2 weeks  . Bulging disc    pt. states she has 3 bulging disc  . COPD (chronic obstructive pulmonary disease) (HCC)   . Diabetes mellitus without complication (HCC)   . Fracture of coccyx (HCC) 12/2012  . Hypertension   . Shortness of breath   . Sleep apnea     Past Surgical History:  Procedure Laterality Date  . ABDOMINAL HYSTERECTOMY    . MULTIPLE EXTRACTIONS WITH ALVEOLOPLASTY N/A 09/07/2013   Procedure: MULTIPLE EXTRACION WITH ALVEOLOPLASTY;  Surgeon: Georgia Lopes, DDS;  [facility] OR;  Service: Oral Surgery;  Laterality: N/A;  . SKIN CANCER EXCISION     head, upper lip, right hand    There were no vitals filed for this visit.      Subjective Assessment - 04/23/16 1549    Subjective Reports that she has been up cooking dinner and pulling weeds already today and pain is increased. Reports that by the end of the day her pain is usually high and the stretches are the only way she gets relief at night. Goes for injection in her back 05/08/2016.   Limitations Sitting;Standing;Walking   How long can you sit comfortably? 10 minutes.   How long can you stand comfortably? 10 minutes.   How long can you walk comfortably?  Short distances.   Patient Stated Goals Get rid of my pain so i can have a better quality of life.   Currently in Pain? Yes   Pain Score 9    Pain Location Back   Pain Orientation Right   Pain Descriptors / Indicators Throbbing   Pain Type Chronic pain   Pain Radiating Towards down RLE   Pain Onset More than a month ago            [facility] PT Assessment - 04/23/16 0001      Assessment   Medical Diagnosis Right lumbar radicular pain.   Hand Dominance Left     Precautions   Precautions None     Restrictions   Weight Bearing Restrictions No                     [facility] Adult PT Treatment/Exercise - 04/23/16 0001      Modalities   Modalities Electrical Stimulation;Moist Heat;Ultrasound     Moist Heat Therapy   Number Minutes Moist Heat 15 Minutes   Moist Heat Location Lumbar Spine     Electrical Stimulation   Electrical Stimulation Location R glute/ piriformis   Electrical Stimulation Action Pre-Mod   Electrical Stimulation Parameters 80-150 hz x15 min   Electrical Stimulation Goals Pain  Ultrasound   Ultrasound Location R Piriformis   Ultrasound Parameters 1.5 w/cm2, 100%, 1 mhz x10 min   Ultrasound Goals Pain     Manual Therapy   Manual Therapy Myofascial release   Myofascial Release MFR/TPR to R Glute/Piriformis to decrease tightness and pain in L sidelying                  PT Short Term Goals - 04/16/16 1014      PT SHORT TERM GOAL #1   Title Ind with initial HEP.   Time 2   Period Weeks   Status Achieved           PT Long Term Goals - 04/16/16 1035      PT LONG TERM GOAL #1   Title Ind with an advanced HEP.   Time 8   Period Weeks   Status On-going     PT LONG TERM GOAL #2   Title Sit 30 minutes with pain not > 3-4/10.   Time 8   Period Weeks   Status On-going     PT LONG TERM GOAL #3   Title Stand 30 minutes with pain not > 3-4/10.   Time 8   Period Weeks   Status On-going     PT LONG TERM GOAL #4   Title  Perform ADL's with pain not > 4/10.   Time 8   Period Weeks   Status On-going     PT LONG TERM GOAL #5   Title Eliminate right LE pain.   Time 8   Period Weeks   Status On-going               Plan - 04/23/16 1557    Clinical Impression Statement Patient tolerated today's treatment fairly well as she arrived with increased pain. Patient continues to have increased pain with activity and especially by the end of the day. Patient presented with increased tightness in R Glute and Piriformis. Normal modalites response noted following removal of the modalities. Patient experienced no difference upon standing following end of treatment. Patient was educated to use TENS unit and heating pad 15-20 minutes at home.   Rehab Potential Good   PT Frequency 2x / week   PT Duration 8 weeks   PT Treatment/Interventions ADLs/Self Care Home Management;Cryotherapy;Electrical Stimulation;Moist Heat;Therapeutic exercise;Therapeutic activities;Ultrasound;Traction;Patient/family education;Manual techniques   PT Next Visit Plan STW/M and PA mobs to lumbar region; right Piriformis release.  Core exercises (flexion biased); Int traction beginning at 30% body weight is an option should she not respond to aforementioned POC.   PT Home Exercise Plan HEP- SKTC, Piriformis stretch   Consulted and Agree with Plan of Care Patient      Patient will benefit from skilled therapeutic intervention in order to improve the following deficits and impairments:  Decreased activity tolerance, Pain, Decreased range of motion  Visit Diagnosis: Right-sided low back pain with right-sided sciatica  Abnormal posture     Problem List There are no active problems to display for this patient.   Evelene Croon, PTA 04/23/2016, 4:22 PM  [facility] [address] [place], [place], [address] Phone: [phone]   Fax:  [phone]  Name: Sheena Marquez MRN: [id number] Date  of Birth: December 24, 1950

## 2016-04-26 ENCOUNTER — Encounter: Payer: Self-pay | Admitting: Physical Therapy

## 2016-04-26 ENCOUNTER — Ambulatory Visit: Payer: Medicare Other | Attending: Internal Medicine | Admitting: Physical Therapy

## 2016-04-26 DIAGNOSIS — R293 Abnormal posture: Secondary | ICD-10-CM | POA: Diagnosis present

## 2016-04-26 DIAGNOSIS — M5441 Lumbago with sciatica, right side: Secondary | ICD-10-CM | POA: Diagnosis present

## 2016-04-26 NOTE — Therapy (Signed)
[facility] [address] [place], [place], [address] Phone: [phone]   Fax:  [phone]  Physical Therapy Treatment  Patient Details  Name: Sheena Marquez MRN: [id number] Date of Birth: 1951/08/25 Referring Provider: Geni Bers MD  Encounter Date: 04/26/2016      PT End of Session - 04/26/16 1516    Visit Number 7   Number of Visits 12   Date for PT Re-Evaluation 06/01/16   PT Start Time 1516   PT Stop Time 1602   PT Time Calculation (min) 46 min   Activity Tolerance Patient tolerated treatment well   Behavior During Therapy [facility] for tasks assessed/performed      Past Medical History:  Diagnosis Date  . Anxiety    panic attacks  . Asthma 5-6 yrs. ago   asthma attack 5-6 yrs. ago [facility] in ICU 2 weeks  . Bulging disc    pt. states she has 3 bulging disc  . COPD (chronic obstructive pulmonary disease) (HCC)   . Diabetes mellitus without complication (HCC)   . Fracture of coccyx (HCC) 12/2012  . Hypertension   . Shortness of breath   . Sleep apnea     Past Surgical History:  Procedure Laterality Date  . ABDOMINAL HYSTERECTOMY    . MULTIPLE EXTRACTIONS WITH ALVEOLOPLASTY N/A 09/07/2013   Procedure: MULTIPLE EXTRACION WITH ALVEOLOPLASTY;  Surgeon: Georgia Lopes, DDS;  Location: [facility] OR;  Service: Oral Surgery;  Laterality: N/A;  . SKIN CANCER EXCISION     head, upper lip, right hand    There were no vitals filed for this visit.      Subjective Assessment - 04/26/16 1515    Subjective Reports that she could not do anything Tuesday as she had R low back pain down to R calf area. Had decreased pain wednesday and has pain again today that is not as bad as when she left Monday.   Limitations Sitting;Standing;Walking   How long can you sit comfortably? 10 minutes.   How long can you stand comfortably? 10 minutes.   How long can you walk comfortably? Short distances.   Patient Stated Goals Get rid of my pain so i can  have a better quality of life.   Currently in Pain? Yes   Pain Score 7    Pain Location Back  Worst from R knee to R little toe   Pain Orientation Right   Pain Descriptors / Indicators Throbbing   Pain Type Chronic pain   Pain Onset More than a month ago            [facility] PT Assessment - 04/26/16 0001      Assessment   Medical Diagnosis Right lumbar radicular pain.   Hand Dominance Left     Precautions   Precautions None     Restrictions   Weight Bearing Restrictions No                     [facility] Adult PT Treatment/Exercise - 04/26/16 0001      Modalities   Modalities Electrical Stimulation;Moist Heat;Ultrasound     Moist Heat Therapy   Number Minutes Moist Heat 15 Minutes   Moist Heat Location Lumbar Spine     Electrical Stimulation   Electrical Stimulation Location R glute/ piriformis   Electrical Stimulation Action IFC   Electrical Stimulation Parameters 1-10 hz x15 min   Electrical Stimulation Goals Pain     Ultrasound   Ultrasound Location R Piriformis/  low back   Ultrasound Parameters 1.5 w/cm2, 100%, 1 mhz x10 min   Ultrasound Goals Pain     Manual Therapy   Manual Therapy Myofascial release   Myofascial Release MFR/TPR to R Glute/Piriformis/lumbar paraspinals to decrease tightness and pain in L sidelying                  PT Short Term Goals - 04/16/16 1014      PT SHORT TERM GOAL #1   Title Ind with initial HEP.   Time 2   Period Weeks   Status Achieved           PT Long Term Goals - 04/16/16 1035      PT LONG TERM GOAL #1   Title Ind with an advanced HEP.   Time 8   Period Weeks   Status On-going     PT LONG TERM GOAL #2   Title Sit 30 minutes with pain not > 3-4/10.   Time 8   Period Weeks   Status On-going     PT LONG TERM GOAL #3   Title Stand 30 minutes with pain not > 3-4/10.   Time 8   Period Weeks   Status On-going     PT LONG TERM GOAL #4   Title Perform ADL's with pain not > 4/10.   Time 8    Period Weeks   Status On-going     PT LONG TERM GOAL #5   Title Eliminate right LE pain.   Time 8   Period Weeks   Status On-going               Plan - 04/26/16 1559    Clinical Impression Statement Patient tolerated today's treatment well with no reports of increased pain during treatment. Tightness continues to be noted in R glute/ piriformis region today but no verablizations of soreness or tenderness. Normal modalities response noted following removal of the modalities. Patient experienced decrease in R low back and buttock region pain in L sidelying but states that R calf pain is constant. Patient experienced low back feeling "okay" after today's treatment.   Rehab Potential Good   PT Frequency 2x / week   PT Duration 8 weeks   PT Treatment/Interventions ADLs/Self Care Home Management;Cryotherapy;Electrical Stimulation;Moist Heat;Therapeutic exercise;Therapeutic activities;Ultrasound;Traction;Patient/family education;Manual techniques   PT Next Visit Plan Continue with modalities and STW to R Piriformis and low back per MPT POC.   PT Home Exercise Plan HEP- SKTC, Piriformis stretch   Consulted and Agree with Plan of Care Patient      Patient will benefit from skilled therapeutic intervention in order to improve the following deficits and impairments:  Decreased activity tolerance, Pain, Decreased range of motion  Visit Diagnosis: Right-sided low back pain with right-sided sciatica  Abnormal posture     Problem List There are no active problems to display for this patient.   Evelene Croon, PTA 04/26/2016, 4:19 PM  [facility] [address] [place], [place], [address] Phone: [phone]   Fax:  [phone]  Name: Sheena Marquez MRN: [id number] Date of Birth: September 26, 1950

## 2016-05-02 ENCOUNTER — Ambulatory Visit: Payer: Medicare Other | Admitting: Physical Therapy

## 2016-05-02 DIAGNOSIS — M5441 Lumbago with sciatica, right side: Secondary | ICD-10-CM

## 2016-05-02 DIAGNOSIS — R293 Abnormal posture: Secondary | ICD-10-CM

## 2016-05-02 NOTE — Therapy (Signed)
[facility] [address] [place], [place], [address] Phone: [phone]   Fax:  [phone]  Physical Therapy Treatment  Patient Details  Name: Sheena Marquez MRN: [id number] Date of Birth: [birth year] Referring Provider: Geni Bers MD  Encounter Date: 05/02/2016      PT End of Session - 05/02/16 1513    Visit Number 8   Number of Visits 12   Date for PT Re-Evaluation 06/01/16   PT Start Time 1440   PT Stop Time 1528   PT Time Calculation (min) 48 min   Activity Tolerance Patient tolerated treatment well   Behavior During Therapy [facility] for tasks assessed/performed      Past Medical History:  Diagnosis Date  . Anxiety    panic attacks  . Asthma 5-6 yrs. ago   asthma attack 5-6 yrs. ago [facility] in ICU 2 weeks  . Bulging disc    pt. states she has 3 bulging disc  . COPD (chronic obstructive pulmonary disease) (HCC)   . Diabetes mellitus without complication (HCC)   . Fracture of coccyx (HCC) 12/2012  . Hypertension   . Shortness of breath   . Sleep apnea     Past Surgical History:  Procedure Laterality Date  . ABDOMINAL HYSTERECTOMY    . MULTIPLE EXTRACTIONS WITH ALVEOLOPLASTY N/A 09/07/2013   Procedure: MULTIPLE EXTRACION WITH ALVEOLOPLASTY;  Surgeon: Georgia Lopes, DDS;  Location: [facility] OR;  Service: Oral Surgery;  Laterality: N/A;  . SKIN CANCER EXCISION     head, upper lip, right hand    There were no vitals filed for this visit.      Subjective Assessment - 05/02/16 1535    Subjective Reports that her back is still about the same. Reports knots forming all lateral edge of R shin and that is where the pain is. States that she gets her next shot next week but continues to have sharp pain when standing to the point of seeing spots. States that she cannot go to the grocery store due to increased pain with increased standing and if she has a cleaning day at home she has to rest the rest of the day.   Limitations  Sitting;Standing;Walking   How long can you sit comfortably? 10 minutes.   How long can you stand comfortably? 10 minutes.   How long can you walk comfortably? Short distances.   Patient Stated Goals Get rid of my pain so i can have a better quality of life.   Currently in Pain? Yes   Pain Score 8    Pain Location Back   Pain Orientation Right;Lower   Pain Descriptors / Indicators Sharp;Shooting   Pain Type Chronic pain   Pain Radiating Towards down RLE   Pain Onset More than a month ago   Aggravating Factors  Prolonged standing, walking   Pain Relieving Factors Rest            [facility] PT Assessment - 05/02/16 0001      Assessment   Medical Diagnosis Right lumbar radicular pain.   Hand Dominance Left     Precautions   Precautions None     Restrictions   Weight Bearing Restrictions No                     [facility] Adult PT Treatment/Exercise - 05/02/16 0001      Modalities   Modalities Electrical Stimulation;Moist Heat;Ultrasound     Moist Heat Therapy   Number Minutes Moist Heat  15 Minutes   Moist Heat Location Lumbar Spine     Electrical Stimulation   Electrical Stimulation Location R glute/ piriformis   Electrical Stimulation Action IFC   Electrical Stimulation Parameters 80-150 hz x15 min   Electrical Stimulation Goals Pain     Ultrasound   Ultrasound Location R Glute/ Piriformis   Ultrasound Parameters 1.5 w/cm2, 100%, 1 mhz x15 min   Ultrasound Goals Pain     Manual Therapy   Manual Therapy Myofascial release   Myofascial Release STW/ MFR to R Glute/ Piriformis/ lumbar paraspinals in L sidelying to decrease tightness and pain                  PT Short Term Goals - 04/16/16 1014      PT SHORT TERM GOAL #1   Title Ind with initial HEP.   Time 2   Period Weeks   Status Achieved           PT Long Term Goals - 04/16/16 1035      PT LONG TERM GOAL #1   Title Ind with an advanced HEP.   Time 8   Period Weeks   Status  On-going     PT LONG TERM GOAL #2   Title Sit 30 minutes with pain not > 3-4/10.   Time 8   Period Weeks   Status On-going     PT LONG TERM GOAL #3   Title Stand 30 minutes with pain not > 3-4/10.   Time 8   Period Weeks   Status On-going     PT LONG TERM GOAL #4   Title Perform ADL's with pain not > 4/10.   Time 8   Period Weeks   Status On-going     PT LONG TERM GOAL #5   Title Eliminate right LE pain.   Time 8   Period Weeks   Status On-going               Plan - 05/02/16 1539    Clinical Impression Statement Patient continues to present in clinic with increased R low back pain that radiates down RLE. Upon observation of the knots forming along R shin it was oberved that patient is also developing knots along L shin as well which patient reported made her feel better but was advised she could contact her MD to get their opinion. Patient continues to experience increased pain with increased standing activity. Tightness noted today in R lumbar paraspinals and minimally into glute/ Piriformis musculature. Normal modalities response noted following today's treatment. Patient was observed walking out of gym area with an antaglic gait secondary to the lumbar pain. LT goals have not been achieved at this time secondary to the increased pain she continues to have.   Rehab Potential Good   PT Frequency 2x / week   PT Duration 8 weeks   PT Treatment/Interventions ADLs/Self Care Home Management;Cryotherapy;Electrical Stimulation;Moist Heat;Therapeutic exercise;Therapeutic activities;Ultrasound;Traction;Patient/family education;Manual techniques   PT Next Visit Plan Continue with modalities and STW to R Piriformis and low back per MPT POC.   PT Home Exercise Plan HEP- SKTC, Piriformis stretch   Consulted and Agree with Plan of Care Patient      Patient will benefit from skilled therapeutic intervention in order to improve the following deficits and impairments:  Decreased  activity tolerance, Pain, Decreased range of motion  Visit Diagnosis: Right-sided low back pain with right-sided sciatica  Abnormal posture     Problem List There are no  active problems to display for this patient.   Evelene Croon, PTA 05/02/2016, 3:45 PM  [facility] [address] [place], [place], [address] Phone: [phone]   Fax:  [phone]  Name: CARMINA WALLE MRN: [id number] Date of Birth: May 05, 1951

## 2016-05-07 ENCOUNTER — Ambulatory Visit: Payer: Medicare Other | Admitting: Physical Therapy

## 2016-05-07 ENCOUNTER — Encounter: Payer: Self-pay | Admitting: Physical Therapy

## 2016-05-07 DIAGNOSIS — M5441 Lumbago with sciatica, right side: Secondary | ICD-10-CM

## 2016-05-07 DIAGNOSIS — R293 Abnormal posture: Secondary | ICD-10-CM

## 2016-05-07 NOTE — Therapy (Signed)
[facility] [address] [place], [place], [address] Phone: [phone]   Fax:  [phone]  Physical Therapy Treatment  Patient Details  Name: Sheena SaltsBonnie S Marquez MRN: [id number] Date of Birth: 1951-09-17 Referring Provider: Geni BersKristy Barnes MD  Encounter Date: 05/07/2016      PT End of Session - 05/07/16 1303    Visit Number 9   Number of Visits 12   Date for PT Re-Evaluation 06/01/16   PT Start Time 1305   PT Stop Time 1352   PT Time Calculation (min) 47 min   Activity Tolerance Patient tolerated treatment well   Behavior [facility] Therapy [facility] for tasks assessed/performed      Past Medical History:  Diagnosis Date  . Anxiety    panic attacks  . Asthma 5-6 yrs. ago   [facility]th[facility]rs. [facility]  . Bulging disc    pt. states she has 3 bulging disc  . COPD (chronic obstructive pulmonary disease) (HCC)   . Diabetes mellitus without complication (HCC)   . Fracture of coccyx (HCC) 12/2012  . Hypertension   . Shortness of breath   . Sleep apnea     Past Surgical History:  Procedure Laterality Date  . ABDOMINAL HYSTERECTOMY    . MULTIPLE EXTRACTIONS WITH ALVEOLOPLASTY N/A 09/07/2013   Procedure: MULTIPLE EXTRACION WITH ALVEOLOPLASTY;  Surgeon: Sheena LopesScott M Marquez, DDS;  Location: [facility] OR;  Service: Oral Surgery;  Laterality: N/A;  . SKIN CANCER EXCISION     head, upper lip, right hand    There were no vitals filed for this visit.      Subjective Assessment - 05/07/16 1257    Subjective Reports having stomach bug this weeekend and not much activitu.   Limitations Sitting;Standing;Walking   How long can you sit comfortably? 10 minutes.   How long can you stand comfortably? 10 minutes.   How long can you walk comfortably? Short distances.   Patient Stated Goals Get rid of my pain so i can have a better quality of life.   Currently in Pain? Yes   Pain Score 3    Pain Location Back   Pain Orientation Right;Lower    Pain Descriptors / Indicators Throbbing;Dull   Pain Type Chronic pain   Pain Radiating Towards R buttock   Pain Onset More than a month ago            [facility] - 05/07/16 0001      Assessment   Medical Diagnosis Right lumbar radicular pain.   Hand Dominance Left     Precautions   Precautions None     Restrictions   Weight Bearing Restrictions No                     [facility] Adult PT Treatment/Exercise - 05/07/16 0001      Modalities   Modalities Electrical Stimulation;Moist Heat;Ultrasound     Moist Heat Therapy   Number Minutes Moist Heat 15 Minutes   Moist Heat Location Lumbar Spine     Electrical Stimulation   Electrical Stimulation Location R glute/ piriformis   Electrical Stimulation Action Pre-Mod   Electrical Stimulation Parameters 80-150 hz x15 min   Electrical Stimulation Goals Pain     Ultrasound   Ultrasound Location R lumbar paraspinals/ glute   Ultrasound Parameters 1.5 w/cm2, 100%, 1mhz x10 min   Ultrasound Goals Pain     Manual Therapy   Manual Therapy Myofascial release  Myofascial Release STW/ MFR to R Glute/ Piriformis/ lumbar paraspinals in L sidelying to decrease tightness and pain                  PT Short Term Goals - 04/16/16 1014      PT SHORT TERM GOAL #1   Title Ind with initial HEP.   Time 2   Period Weeks   Status Achieved           PT Long Term Goals - 05/07/16 1346      PT LONG TERM GOAL #1   Title Ind with an advanced HEP.   Time 8   Period Weeks   Status On-going     PT LONG TERM GOAL #2   Title Sit 30 minutes with pain not > 3-4/10.   Time 8   Period Weeks   Status Achieved     PT LONG TERM GOAL #3   Title Stand 30 minutes with pain not > 3-4/10.   Time 8   Period Weeks   Status On-going  Not attainable 05/07/2016     PT LONG TERM GOAL #4   Title Perform ADL's with pain not > 4/10.   Time 8   Period Weeks   Status On-going  Usually by 1 pm pain is high per patient  report 05/07/2016     PT LONG TERM GOAL #5   Title Eliminate right LE pain.   Time 8   Period Weeks   Status On-going               Plan - 05/07/16 1340    Clinical Impression Statement Patient arrived wtih 3/10 R lumbar pain that was only radiating to R buttock. Increased R lumbar paraspinals tightness present today as well as into R glute musculature. Patient did not report any increased soreness or tenderness with manual therapy today. Normal modalities response noted following removal of the modalities. Achieved sitting goal today but unable to achieve standing or ADLs goal as increased standing and activity increases pain. Sitting okay as long as she puts most of her weight through L buttock.    Rehab Potential Good   PT Frequency 2x / week   PT Duration 8 weeks   PT Treatment/Interventions ADLs/Self Care Home Management;Cryotherapy;Electrical Stimulation;Moist Heat;Therapeutic exercise;Therapeutic activities;Ultrasound;Traction;Patient/family education;Manual techniques   PT Next Visit Plan Continue with modalities and STW to R Piriformis and low back per MPT POC.   PT Home Exercise Plan HEP- SKTC, Piriformis stretch   Consulted and Agree with Plan of Care Patient      Patient will benefit from skilled therapeutic intervention in order to improve the following deficits and impairments:  Decreased activity tolerance, Pain, Decreased range of motion  Visit Diagnosis: Right-sided low back pain with right-sided sciatica  Abnormal posture     Problem List There are no active problems to display for this patient.   Florence CannerKelsey Parsons, PTA 05/07/16 1:56 PM Sheena Marquez MPT [facility] [address] [place], [place], [address] Phone: [phone]   Fax:  [phone]  Name: Sheena SaltsBonnie S Marquez MRN: [id number] Date of Birth: [birth year]

## 2016-05-10 ENCOUNTER — Ambulatory Visit: Payer: Medicare Other | Admitting: *Deleted

## 2016-05-10 DIAGNOSIS — M5441 Lumbago with sciatica, right side: Secondary | ICD-10-CM

## 2016-05-10 DIAGNOSIS — R293 Abnormal posture: Secondary | ICD-10-CM

## 2016-05-10 NOTE — Therapy (Signed)
[facility] [address] [place], [place], [address] Phone: [phone]   Fax:  [phone]  Physical Therapy Treatment  Patient Details  Name: Sheena Marquez MRN: [id number] Date of Birth: May 19, 1951 Referring Provider: Geni BersKristy Barnes MD  Encounter Date: 05/10/2016      PT End of Session - 05/10/16 1423    Visit Number 10   Number of Visits 12   Date for PT Re-Evaluation 06/01/16   PT Start Time 1300   PT Stop Time 1341   PT Time Calculation (min) 41 min      Past Medical History:  Diagnosis Date  . Anxiety    panic attacks  . Asthma 5-6 yrs. ago   asthma [facility] yrs. ago [facility]  . Bulging disc    pt. states she has 3 bulging disc  . COPD (chronic obstructive pulmonary disease) (HCC)   . Diabetes mellitus without complication (HCC)   . Fracture of coccyx (HCC) 12/2012  . Hypertension   . Shortness of breath   . Sleep apnea     Past Surgical History:  Procedure Laterality Date  . ABDOMINAL HYSTERECTOMY    . MULTIPLE EXTRACTIONS WITH ALVEOLOPLASTY N/A 09/07/2013   Procedure: MULTIPLE EXTRACION WITH ALVEOLOPLASTY;  Surgeon: Georgia LopesScott M Jensen, DDS;  Location: [facility] OR;  Service: Oral Surgery;  Laterality: N/A;  . SKIN CANCER EXCISION     head, upper lip, right hand    There were no vitals filed for this visit.      Subjective Assessment - 05/10/16 1303    Subjective Had LB injections yesterday.   Limitations Sitting;Standing;Walking   How long can you sit comfortably? 10 minutes.   How long can you stand comfortably? 10 minutes.   How long can you walk comfortably? Short distances.   Patient Stated Goals Get rid of my pain so i can have a better quality of life.   Currently in Pain? Yes   Pain Score 3    Pain Location Back   Pain Orientation Right;Lower   Pain Descriptors / Indicators Throbbing;Dull   Pain Type Chronic pain   Pain Onset More than a month ago   Pain Frequency Constant                          [facility] Adult PT Treatment/Exercise - 05/10/16 0001      Modalities   Modalities Electrical Stimulation;Moist Heat;Traction     Moist Heat Therapy   Number Minutes Moist Heat 15 Minutes   Moist Heat Location Lumbar Spine     Electrical Stimulation   Electrical Stimulation Location R glute/ piriformis   Electrical Stimulation Action IFC   Electrical Stimulation Parameters 80-150HZ  x 15 mins   Electrical Stimulation Goals Pain     Traction   Type of Traction Lumbar   Min (lbs) 5   Max (lbs) 90   Hold Time 99   Rest Time 5   Time 15                  PT Short Term Goals - 04/16/16 1014      PT SHORT TERM GOAL #1   Title Ind with initial HEP.   Time 2   Period Weeks   Status Achieved           PT Long Term Goals - 05/07/16 1346      PT LONG TERM GOAL #1   Title Ind  with an advanced HEP.   Time 8   Period Weeks   Status On-going     PT LONG TERM GOAL #2   Title Sit 30 minutes with pain not > 3-4/10.   Time 8   Period Weeks   Status Achieved     PT LONG TERM GOAL #3   Title Stand 30 minutes with pain not > 3-4/10.   Time 8   Period Weeks   Status On-going  Not attainable 05/07/2016     PT LONG TERM GOAL #4   Title Perform ADL's with pain not > 4/10.   Time 8   Period Weeks   Status On-going  Usually by 1 pm pain is high per patient report 05/07/2016     PT LONG TERM GOAL #5   Title Eliminate right LE pain.   Time 8   Period Weeks   Status On-going               Plan - 05/10/16 1309    Clinical Impression Statement Pt arrived today and feels that her pain is about the same and only gets temporary relief. She denied wanting US or STW today and pelvic traction at 90#s (40% of 225) was performed and Pt did very well and thinks it might help. Pt states she is getting tired of the pain and might need to see an Orthopeadic DR.   PT Frequency 2x / week   PT Duration 8 weeks   PT  Treatment/Interventions ADLs/Self Care Home Management;Cryotherapy;Electrical Stimulation;Moist Heat;Therapeutic exercise;Therapeutic activities;Ultrasound;Traction;Patient/family education;Manual techniques   PT Next Visit Plan Continue with modalities and STW to R Piriformis and low back per MPT POC.    Assess Pelvic Traction and progress to 95 #s if ok.   PT Home Exercise Plan HEP- SKTC, Piriformis stretch   Consulted and Agree with Plan of Care Patient      Patient will benefit from skilled therapeutic intervention in order to improve the following deficits and impairments:  Decreased activity tolerance, Pain, Decreased range of motion  Visit Diagnosis: Right-sided low back pain with right-sided sciatica  Abnormal posture     Problem List There are no active problems to display for this patient.   Nickey Canedo,CHRIS, PTA 05/10/2016, 2:25 PM  [facility] [address] [place], [place], [address] Phone: [phone]   Fax:  [phone]  Name: Sheena Marquez MRN: [id number] Date of Birth: [birth year]

## 2016-05-16 ENCOUNTER — Ambulatory Visit: Payer: Medicare Other | Admitting: Physical Therapy

## 2016-05-16 DIAGNOSIS — M5441 Lumbago with sciatica, right side: Secondary | ICD-10-CM

## 2016-05-16 NOTE — Therapy (Signed)
[facility] [address] [place], [place], [address] Phone: [phone]   Fax:  [phone]  Physical Therapy Treatment  Patient Details  Name: Sheena Marquez MRN: [id number] Date of Birth: May 13, 1951 Referring Provider: Geni BersKristy Barnes MD  Encounter Date: 05/16/2016      PT End of Session - 05/16/16 1057    Visit Number 11   Number of Visits 24   Date for PT Re-Evaluation 07/31/16   PT Start Time 0858   PT Stop Time 1023   PT Time Calculation (min) 85 min   Activity Tolerance Patient tolerated treatment well   Behavior During Therapy [facility] for tasks assessed/performed      Past Medical History:  Diagnosis Date  . Anxiety    panic attacks  . Asthma 5-6 yrs. ago   [facility]th[facility]rs. [facility]  . Bulging disc    pt. states she has 3 bulging disc  . COPD (chronic obstructive pulmonary disease) (HCC)   . Diabetes mellitus without complication (HCC)   . Fracture of coccyx (HCC) 12/2012  . Hypertension   . Shortness of breath   . Sleep apnea     Past Surgical History:  Procedure Laterality Date  . ABDOMINAL HYSTERECTOMY    . MULTIPLE EXTRACTIONS WITH ALVEOLOPLASTY N/A 09/07/2013   Procedure: MULTIPLE EXTRACION WITH ALVEOLOPLASTY;  Surgeon: Georgia LopesScott M Jensen, DDS;  Location: [facility] OR;  Service: Oral Surgery;  Laterality: N/A;  . SKIN CANCER EXCISION     head, upper lip, right hand    There were no vitals filed for this visit.      Subjective Assessment - 05/16/16 1048    Subjective Traction felt good.   Pain Score 2    Pain Location Back   Pain Orientation Right;Lower   Pain Descriptors / Indicators Throbbing;Dull   Pain Type Chronic pain                         [facility] Adult PT Treatment/Exercise - 05/16/16 0001      Modalities   Modalities Electrical Stimulation;Moist Heat;Traction;Ultrasound     Moist Heat Therapy   Number Minutes Moist Heat 20 Minutes   Moist Heat Location  Lumbar Spine     Electrical Stimulation   Electrical Stimulation Location RT LB/GLUT/PIRIFORMIS   Electrical Stimulation Action IFC   Electrical Stimulation Parameters 80-150 Hz at 100% scan x 20 minutes.   Electrical Stimulation Goals Pain     Ultrasound   Ultrasound Location RT LB/GLUT   Ultrasound Parameters 1.50 W/CM2 x 12 minutes with patient in left sdly position with pillow between knees for comfort.   Ultrasound Goals Pain     Traction   Type of Traction Lumbar   Min (lbs) 5   Max (lbs) 100   Hold Time 99   Rest Time 5   Time 15     Manual Therapy   Manual Therapy Soft tissue mobilization   Myofascial Release STW/M to right lower lumbar region/gluteal and Piriformis region in left sdly position with pillow between knees for comfort x 12 minutes.                  PT Short Term Goals - 04/16/16 1014      PT SHORT TERM GOAL #1   Title Ind with initial HEP.   Time 2   Period Weeks   Status Achieved  PT Long Term Goals - 05/07/16 1346      PT LONG TERM GOAL #1   Title Ind with an advanced HEP.   Time 8   Period Weeks   Status On-going     PT LONG TERM GOAL #2   Title Sit 30 minutes with pain not > 3-4/10.   Time 8   Period Weeks   Status Achieved     PT LONG TERM GOAL #3   Title Stand 30 minutes with pain not > 3-4/10.   Time 8   Period Weeks   Status On-going  Not attainable 05/07/2016     PT LONG TERM GOAL #4   Title Perform ADL's with pain not > 4/10.   Time 8   Period Weeks   Status On-going  Usually by 1 pm pain is high per patient report 05/07/2016     PT LONG TERM GOAL #5   Title Eliminate right LE pain.   Time 8   Period Weeks   Status On-going               Plan - 05/16/16 1055    Clinical Impression Statement The patient has responded well to her last 2 treatments which has included traction.  Her pain-level was low today.   Rehab Potential Good   PT Frequency 2x / week   PT Duration 8 weeks   PT  Treatment/Interventions ADLs/Self Care Home Management;Cryotherapy;Electrical Stimulation;Moist Heat;Therapeutic exercise;Therapeutic activities;Ultrasound;Traction;Patient/family education;Manual techniques   PT Next Visit Plan Continue with modalities and STW to R Piriformis and low back per MPT POC.    Assess Pelvic Traction and progress to 95 #s if ok.   PT Home Exercise Plan HEP- SKTC, Piriformis stretch   Consulted and Agree with Plan of Care Patient      Patient will benefit from skilled therapeutic intervention in order to improve the following deficits and impairments:  Decreased activity tolerance, Pain, Decreased range of motion  Visit Diagnosis: Right-sided low back pain with right-sided sciatica - Plan: PT plan of care cert/re-cert     Problem List There are no active problems to display for this patient.   Korde Jeppsen, [name] 05/16/2016, 11:02 AM  [facility] [address] [place], [place], [address] Phone: [phone]   Fax:  [phone]  Name: Sheena Marquez MRN: [id number] Date of Birth: 1951-01-17

## 2016-05-18 ENCOUNTER — Ambulatory Visit: Payer: Medicare Other | Admitting: *Deleted

## 2016-05-18 DIAGNOSIS — R293 Abnormal posture: Secondary | ICD-10-CM

## 2016-05-18 DIAGNOSIS — M5441 Lumbago with sciatica, right side: Secondary | ICD-10-CM

## 2016-05-18 NOTE — Therapy (Signed)
[facility] [address] [place], [place], [address] Phone: [phone]   Fax:  [phone]  Physical Therapy Treatment  Patient Details  Name: Sheena Marquez MRN: [id number] Date of Birth: 03/27/1951 Referring Provider: Geni BersKristy Barnes MD  Encounter Date: 05/18/2016      PT End of Session - 05/18/16 1248    Visit Number 12   Number of Visits 24   Date for PT Re-Evaluation 07/31/16   PT Start Time 1145   PT Stop Time 1223   PT Time Calculation (min) 38 min      Past Medical History:  Diagnosis Date  . Anxiety    panic attacks  . Asthma 5-6 yrs. ago   asth[facility] yrs. ago [facility]  . Bulging disc    pt. states she has 3 bulging disc  . COPD (chronic obstructive pulmonary disease) (HCC)   . Diabetes mellitus without complication (HCC)   . Fracture of coccyx (HCC) 12/2012  . Hypertension   . Shortness of breath   . Sleep apnea     Past Surgical History:  Procedure Laterality Date  . ABDOMINAL HYSTERECTOMY    . MULTIPLE EXTRACTIONS WITH ALVEOLOPLASTY N/A 09/07/2013   Procedure: MULTIPLE EXTRACION WITH ALVEOLOPLASTY;  Surgeon: Georgia LopesScott M Jensen, DDS;  Location: [facility] OR;  Service: Oral Surgery;  Laterality: N/A;  . SKIN CANCER EXCISION     head, upper lip, right hand    There were no vitals filed for this visit.      Subjective Assessment - 05/18/16 1247    Subjective Traction, Heat and Estim are helping with less pain   Limitations Sitting;Standing;Walking   How long can you sit comfortably? 10 minutes.   How long can you stand comfortably? 10 minutes.   How long can you walk comfortably? Short distances.   Patient Stated Goals Get rid of my pain so i can have a better quality of life.   Currently in Pain? Yes   Pain Score 3    Pain Location Back   Pain Orientation Right;Lower   Pain Descriptors / Indicators Throbbing;Dull   Pain Type Chronic pain   Pain Onset More than a month ago                          [facility] - 05/18/16 0001      Modalities   Modalities Electrical Stimulation;Moist Heat;Traction;Ultrasound     Moist Heat Therapy   Number Minutes Moist Heat 15 Minutes   Moist Heat Location Lumbar Spine     Electrical Stimulation   Electrical Stimulation Location RT LB/GLUT/PIRIFORMIS   Electrical Stimulation Action IFC x15 mins 80-150hz    Electrical Stimulation Goals Pain     Traction   Type of Traction Lumbar   Min (lbs) 5   Max (lbs) 105   Hold Time 99   Rest Time 5   Time 15                  PT Short Term Goals - 04/16/16 1014      PT SHORT TERM GOAL #1   Title Ind with initial HEP.   Time 2   Period Weeks   Status Achieved           PT Long Term Goals - 05/07/16 1346      PT LONG TERM GOAL #1   Title Ind with an advanced HEP.   Time  8   Period Weeks   Status On-going     PT LONG TERM GOAL #2   Title Sit 30 minutes with pain not > 3-4/10.   Time 8   Period Weeks   Status Achieved     PT LONG TERM GOAL #3   Title Stand 30 minutes with pain not > 3-4/10.   Time 8   Period Weeks   Status On-going  Not attainable 05/07/2016     PT LONG TERM GOAL #4   Title Perform ADL's with pain not > 4/10.   Time 8   Period Weeks   Status On-going  Usually by 1 pm pain is high per patient report 05/07/2016     PT LONG TERM GOAL #5   Title Eliminate right LE pain.   Time 8   Period Weeks   Status On-going               Plan - 05/18/16 1252    Clinical Impression Statement Pt did great again with Rx and was able to tolerate traction at 105#s today. She had mild symptoms in RT hip/glute before Rx, but no symptoms afterwards. She was also able to walk better.  Goals are ongoing   Rehab Potential Good   PT Frequency 2x / week   PT Duration 8 weeks   PT Next Visit Plan    cont with HMP/Estim and  Pelvic Traction   if ok. 105#s   PT Home Exercise Plan HEP- SKTC, Piriformis  stretch   Consulted and Agree with Plan of Care Patient      Patient will benefit from skilled therapeutic intervention in order to improve the following deficits and impairments:  Decreased activity tolerance, Pain, Decreased range of motion  Visit Diagnosis: Right-sided low back pain with right-sided sciatica  Abnormal posture     Problem List There are no active problems to display for this patient.   Sheena Marquez,CHRIS, PTA 05/18/2016, 1:05 PM  [facility] [address] [place], [place], [address] Phone: [phone]   Fax:  [phone]  Name: Sheena Marquez MRN: [id number] Date of Birth: 05-06-1951

## 2016-05-22 ENCOUNTER — Encounter: Payer: Self-pay | Admitting: Physical Therapy

## 2016-05-22 ENCOUNTER — Ambulatory Visit: Payer: Medicare Other | Admitting: Physical Therapy

## 2016-05-22 DIAGNOSIS — M5441 Lumbago with sciatica, right side: Secondary | ICD-10-CM | POA: Diagnosis not present

## 2016-05-22 DIAGNOSIS — R293 Abnormal posture: Secondary | ICD-10-CM

## 2016-05-22 NOTE — Therapy (Signed)
[facility] [address] [place], [place], [address] Phone: [phone]   Fax:  [phone]  Physical Therapy Treatment  Patient Details  Name: Sheena SaltsBonnie S Marquez MRN: [id number] Date of Birth: 10/23/1950 Referring Provider: Geni BersKristy Barnes MD  Encounter Date: 05/22/2016      PT End of Session - 05/22/16 0958    Visit Number 13   Number of Visits 24   Date for PT Re-Evaluation 07/31/16   PT Start Time 0945   PT Stop Time 1029   PT Time Calculation (min) 44 min   Activity Tolerance Patient tolerated treatment well   Behavior During Therapy [facility] for tasks assessed/performed      Past Medical History:  Diagnosis Date  . Anxiety    panic attacks  . Asthma 5-6 yrs. ago   [facility]  . Bulging disc    pt. states she has 3 bulging disc  . COPD (chronic obstructive pulmonary disease) (HCC)   . Diabetes mellitus without complication (HCC)   . Fracture of coccyx (HCC) 12/2012  . Hypertension   . Shortness of breath   . Sleep apnea     Past Surgical History:  Procedure Laterality Date  . ABDOMINAL HYSTERECTOMY    . MULTIPLE EXTRACTIONS WITH ALVEOLOPLASTY N/A 09/07/2013   Procedure: MULTIPLE EXTRACION WITH ALVEOLOPLASTY;  Surgeon: Georgia LopesScott M Jensen, DDS;  Location: [facility] OR;  Service: Oral Surgery;  Laterality: N/A;  . SKIN CANCER EXCISION     head, upper lip, right hand    There were no vitals filed for this visit.      Subjective Assessment - 05/22/16 0953    Subjective patient reported doing well after last treatment   Limitations Sitting;Standing;Walking   How long can you sit comfortably? 10 minutes.   How long can you stand comfortably? 10 minutes.   How long can you walk comfortably? Short distances.   Patient Stated Goals Get rid of my pain so i can have a better quality of life.   Currently in Pain? Yes   Pain Score 5    Pain Location Back   Pain Orientation Right;Lower   Pain  Descriptors / Indicators Throbbing;Dull   Pain Type Chronic pain   Pain Onset More than a month ago   Pain Frequency Constant   Aggravating Factors  prolong activity or standing   Pain Relieving Factors rest                         [facility] Adult PT Treatment/Exercise - 05/22/16 0001      Moist Heat Therapy   Number Minutes Moist Heat 15 Minutes   Moist Heat Location Lumbar Spine     Electrical Stimulation   Electrical Stimulation Location RT LB/GLUT/PIRIFORMIS   Electrical Stimulation Action IFC   Electrical Stimulation Parameters 80-1560hz     Electrical Stimulation Goals Pain     Ultrasound   Ultrasound Location right low back/glut area   Ultrasound Parameters 1.5w/cm2/50%/441mhzx10min   Ultrasound Goals Pain     Traction   Type of Traction Lumbar   Min (lbs) 5   Max (lbs) 110   Hold Time 99   Rest Time 5   Time 15                  PT Short Term Goals - 04/16/16 1014      PT SHORT TERM GOAL #1   Title Ind with initial  HEP.   Time 2   Period Weeks   Status Achieved           PT Long Term Goals - 05/07/16 1346      PT LONG TERM GOAL #1   Title Ind with an advanced HEP.   Time 8   Period Weeks   Status On-going     PT LONG TERM GOAL #2   Title Sit 30 minutes with pain not > 3-4/10.   Time 8   Period Weeks   Status Achieved     PT LONG TERM GOAL #3   Title Stand 30 minutes with pain not > 3-4/10.   Time 8   Period Weeks   Status On-going  Not attainable 05/07/2016     PT LONG TERM GOAL #4   Title Perform ADL's with pain not > 4/10.   Time 8   Period Weeks   Status On-going  Usually by 1 pm pain is high per patient report 05/07/2016     PT LONG TERM GOAL #5   Title Eliminate right LE pain.   Time 8   Period Weeks   Status On-going               Plan - 05/22/16 1009    Clinical Impression Statement Patient progressing with good response to therapy and feels like traction has been helping. Patient has overall  less pain yet still some right LE symptoms that include a dull throbbing which increases with prolong standing or activity. Patient goals ongoing at this time due to pain deficits. Increased traction to #110 per MPT. No pain complaints post treatment today.   Rehab Potential Good   Clinical Impairments Affecting Rehab Potential patient weight 226   PT Frequency 2x / week   PT Duration 8 weeks   PT Treatment/Interventions ADLs/Self Care Home Management;Cryotherapy;Electrical Stimulation;Moist Heat;Therapeutic exercise;Therapeutic activities;Ultrasound;Traction;Patient/family education;Manual techniques   PT Next Visit Plan cont with POC per MPT   Consulted and Agree with Plan of Care Patient      Patient will benefit from skilled therapeutic intervention in order to improve the following deficits and impairments:  Decreased activity tolerance, Pain, Decreased range of motion  Visit Diagnosis: Right-sided low back pain with right-sided sciatica  Abnormal posture     Problem List There are no active problems to display for this patient.   Hermelinda Dellen, PTA 05/22/2016, 10:57 AM  [facility] [address] [place], [place], [address] Phone: [phone]   Fax:  [phone]  Name: Sheena Marquez MRN: [id number] Date of Birth: 10-18-1950

## 2016-05-24 ENCOUNTER — Ambulatory Visit: Payer: Medicare Other | Admitting: Physical Therapy

## 2016-05-24 DIAGNOSIS — R293 Abnormal posture: Secondary | ICD-10-CM

## 2016-05-24 DIAGNOSIS — M5441 Lumbago with sciatica, right side: Secondary | ICD-10-CM

## 2016-05-24 NOTE — Therapy (Signed)
[facility] [address] [place], [place], [address] Phone: [phone]   Fax:  [phone]  Physical Therapy Treatment  Patient Details  Name: Sheena Marquez MRN: [id number] Date of Birth: October 20, 1950 Referring Provider: Geni Bers MD  Encounter Date: 05/24/2016      PT End of Session - 05/24/16 1715    Visit Number 14   Number of Visits 24   Date for PT Re-Evaluation 07/31/16   PT Start Time 0145   PT Stop Time 0232   PT Time Calculation (min) 47 min   Activity Tolerance Patient tolerated treatment well   Behavior During Therapy [facility] for tasks assessed/performed      Past Medical History:  Diagnosis Date  . Anxiety    panic attacks  . Asthma 5-6 yrs. ago   asthma attack 5-6 yrs. ago [facility] in ICU 2 weeks  . Bulging disc    pt. states she has 3 bulging disc  . COPD (chronic obstructive pulmonary disease) (HCC)   . Diabetes mellitus without complication (HCC)   . Fracture of coccyx (HCC) 12/2012  . Hypertension   . Shortness of breath   . Sleep apnea     Past Surgical History:  Procedure Laterality Date  . ABDOMINAL HYSTERECTOMY    . MULTIPLE EXTRACTIONS WITH ALVEOLOPLASTY N/A 09/07/2013   Procedure: MULTIPLE EXTRACION WITH ALVEOLOPLASTY;  Surgeon: Georgia Lopes, DDS;  Location: [facility] OR;  Service: Oral Surgery;  Laterality: N/A;  . SKIN CANCER EXCISION     head, upper lip, right hand    There were no vitals filed for this visit.      Subjective Assessment - 05/24/16 1739    Subjective I think he traction might be helping.   Pain Score 4    Pain Location Back   Pain Orientation Right;Lower   Pain Descriptors / Indicators Throbbing;Dull   Pain Type Chronic pain   Pain Onset More than a month ago                         [facility] Adult PT Treatment/Exercise - 05/24/16 0001      Moist Heat Therapy   Moist Heat Location --  Lumbar spine.     Programme researcher, broadcasting/film/video  Location RT LB/GLUT/Piriformis.   Electrical Stimulation Action IFC   Electrical Stimulation Parameters 80-150 HZ    Electrical Stimulation Goals Pain     Traction   Type of Traction Lumbar   Min (lbs) 5   Max (lbs) 115   Hold Time 99   Rest Time 5   Time 15     Manual Therapy   Manual Therapy Soft tissue mobilization   Manual therapy comments From left SDLY position performed gentle mobs and STW/M     Soft tissue mobilization x 8 minutes to affected lower lumbar x 8 minutes.                  PT Short Term Goals - 04/16/16 1014      PT SHORT TERM GOAL #1   Title Ind with initial HEP.   Time 2   Period Weeks   Status Achieved           PT Long Term Goals - 05/07/16 1346      PT LONG TERM GOAL #1   Title Ind with an advanced HEP.   Time 8   Period Weeks   Status On-going  PT LONG TERM GOAL #2   Title Sit 30 minutes with pain not > 3-4/10.   Time 8   Period Weeks   Status Achieved     PT LONG TERM GOAL #3   Title Stand 30 minutes with pain not > 3-4/10.   Time 8   Period Weeks   Status On-going  Not attainable 05/07/2016     PT LONG TERM GOAL #4   Title Perform ADL's with pain not > 4/10.   Time 8   Period Weeks   Status On-going  Usually by 1 pm pain is high per patient report 05/07/2016     PT LONG TERM GOAL #5   Title Eliminate right LE pain.   Time 8   Period Weeks   Status On-going             Patient will benefit from skilled therapeutic intervention in order to improve the following deficits and impairments:     Visit Diagnosis: Right-sided low back pain with right-sided sciatica  Abnormal posture     Problem List There are no active problems to display for this patient.   Taima Rada, [name] 05/24/2016, 5:51 PM  [facility] [address] [place], [place], [address] Phone: [phone]   Fax:  [phone]  Name: Sheena Marquez MRN: [id number] Date of Birth:  11-Jul-1951

## 2016-05-29 ENCOUNTER — Ambulatory Visit: Payer: Medicare Other | Attending: Internal Medicine | Admitting: *Deleted

## 2016-05-29 DIAGNOSIS — M5441 Lumbago with sciatica, right side: Secondary | ICD-10-CM

## 2016-05-29 DIAGNOSIS — R293 Abnormal posture: Secondary | ICD-10-CM

## 2016-05-29 NOTE — Therapy (Signed)
[facility] [address] [place], [place], [address] Phone: [phone]   Fax:  [phone]  Physical Therapy Treatment  Patient Details  Name: Valerie SaltsBonnie S Teichert MRN: [id number] Date of Birth: [birth year] Referring Provider: Geni BersKristy Barnes MD  Encounter Date: 05/29/2016      PT End of Session - 05/29/16 1520    Visit Number 15   Number of Visits 24   Date for PT Re-Evaluation 07/31/16   PT Start Time 1515   PT Stop Time 1553  Pt requested estim and traction only   PT Time Calculation (min) 38 min      Past Medical History:  Diagnosis Date  . Anxiety    panic attacks  . Asthma 5-6 yrs. ago   [facility]  . Bulging disc    pt. states she has 3 bulging disc  . COPD (chronic obstructive pulmonary disease) (HCC)   . Diabetes mellitus without complication (HCC)   . Fracture of coccyx (HCC) 12/2012  . Hypertension   . Shortness of breath   . Sleep apnea     Past Surgical History:  Procedure Laterality Date  . ABDOMINAL HYSTERECTOMY    . MULTIPLE EXTRACTIONS WITH ALVEOLOPLASTY N/A 09/07/2013   Procedure: MULTIPLE EXTRACION WITH ALVEOLOPLASTY;  Surgeon: Georgia LopesScott M Jensen, DDS;  Location: [facility] OR;  Service: Oral Surgery;  Laterality: N/A;  . SKIN CANCER EXCISION     head, upper lip, right hand    There were no vitals filed for this visit.      Subjective Assessment - 05/29/16 1519    Subjective I think he traction might be helping. Would like to try heat/estim and traction today and then make a decision to continue or see an ortho.   Limitations Sitting;Standing;Walking   How long can you sit comfortably? 10 minutes.   How long can you stand comfortably? 10 minutes.   How long can you walk comfortably? Short distances.   Patient Stated Goals Get rid of my pain so i can have a better quality of life.                         [facility] Adult PT Treatment/Exercise - 05/29/16 0001      Moist Heat Therapy   Number Minutes Moist Heat 15 Minutes   Moist Heat Location Lumbar Spine     Electrical Stimulation   Electrical Stimulation Location RT LB/GLUT/Piriformis.   Electrical Stimulation Action IFC   Electrical Stimulation Parameters 80-150hz    Electrical Stimulation Goals Pain     Traction   Type of Traction Lumbar   Min (lbs) 5   Max (lbs) 115   Hold Time 99   Rest Time 5   Time 15                  PT Short Term Goals - 04/16/16 1014      PT SHORT TERM GOAL #1   Title Ind with initial HEP.   Time 2   Period Weeks   Status Achieved           PT Long Term Goals - 05/07/16 1346      PT LONG TERM GOAL #1   Title Ind with an advanced HEP.   Time 8   Period Weeks   Status On-going     PT LONG TERM GOAL #2   Title Sit 30 minutes with pain not > 3-4/10.  Time 8   Period Weeks   Status Achieved     PT LONG TERM GOAL #3   Title Stand 30 minutes with pain not > 3-4/10.   Time 8   Period Weeks   Status On-going  Not attainable 05/07/2016     PT LONG TERM GOAL #4   Title Perform ADL's with pain not > 4/10.   Time 8   Period Weeks   Status On-going  Usually by 1 pm pain is high per patient report 05/07/2016     PT LONG TERM GOAL #5   Title Eliminate right LE pain.   Time 8   Period Weeks   Status On-going               Plan - 05/29/16 1540    Clinical Impression Statement Pt arrives to clinic today confused about her pain and symtoms. Sometimes she thinks Rx's are helping and then her pain comes back. She wanted to try Traction one or two  more time and then make a decision to continue or go to see an Ortho Dr. We did 115#s  of pelvic traction today and she did well . She had decreased pain into LE after RX   Rehab Potential Good   Clinical Impairments Affecting Rehab Potential patient weight 226   PT Frequency 2x / week   PT Duration 8 weeks   PT Treatment/Interventions ADLs/Self Care Home  Management;Cryotherapy;Electrical Stimulation;Moist Heat;Therapeutic exercise;Therapeutic activities;Ultrasound;Traction;Patient/family education;Manual techniques   PT Next Visit Plan cont with POC per MPT    Cont. Traction or DC?   PT Home Exercise Plan HEP- SKTC, Piriformis stretch   Consulted and Agree with Plan of Care Patient      Patient will benefit from skilled therapeutic intervention in order to improve the following deficits and impairments:  Decreased activity tolerance, Pain, Decreased range of motion  Visit Diagnosis: Right-sided low back pain with right-sided sciatica  Abnormal posture     Problem List There are no active problems to display for this patient.   Ranvir Renovato,CHRIS , PTA 05/29/2016, 4:51 PM  [facility] [address] [place], [place], [address] Phone: [phone]   Fax:  [phone]  Name: ANAI LIPSON MRN: [id number] Date of Birth: 10-Nov-1950

## 2016-05-31 ENCOUNTER — Ambulatory Visit: Payer: Medicare Other | Admitting: Physical Therapy

## 2016-05-31 ENCOUNTER — Encounter: Payer: Self-pay | Admitting: Physical Therapy

## 2016-05-31 DIAGNOSIS — M5441 Lumbago with sciatica, right side: Secondary | ICD-10-CM

## 2016-05-31 DIAGNOSIS — R293 Abnormal posture: Secondary | ICD-10-CM

## 2016-05-31 NOTE — Therapy (Addendum)
[facility] [place], [place], [address] Phone: [phone]   Fax:  [phone]  Physical Therapy Treatment  Patient Details  Name: Sheena Marquez MRN: [id number] Date of Birth: 1951-08-01 Referring Provider: Fuller Plan MD  Encounter Date: 05/31/2016      PT End of Session - 05/31/16 1052    Visit Number 16   Number of Visits 24   Date for PT Re-Evaluation 07/31/16   PT Start Time 1031   PT Stop Time 1106   PT Time Calculation (min) 35 min   Activity Tolerance Patient tolerated treatment well   Behavior During Therapy [facility] for tasks assessed/performed      Past Medical History:  Diagnosis Date  . Anxiety    panic attacks  . Asthma 5-6 yrs. ago   asthma attack 5-6 yrs. ago [facility] in ICU 2 weeks  . Bulging disc    pt. states she has 3 bulging disc  . COPD (chronic obstructive pulmonary disease) ([place])   . Diabetes mellitus without complication ([place])   . Fracture of coccyx ([place]) 12/2012  . Hypertension   . Shortness of breath   . Sleep apnea     Past Surgical History:  Procedure Laterality Date  . ABDOMINAL HYSTERECTOMY    . MULTIPLE EXTRACTIONS WITH ALVEOLOPLASTY N/A 09/07/2013   Procedure: MULTIPLE EXTRACION WITH ALVEOLOPLASTY;  Surgeon: Gae Bon, DDS;  Location: [facility];  Service: Oral Surgery;  Laterality: N/A;  . SKIN CANCER EXCISION     head, upper lip, right hand    There were no vitals filed for this visit.      Subjective Assessment - 05/31/16 1042    Subjective Patient reported only heat/ES and traction today and would like today to be last visit and be on hold   Limitations Sitting;Standing;Walking   How long can you sit comfortably? 10 minutes.   How long can you stand comfortably? 10 minutes.   How long can you walk comfortably? Short distances.   Patient Stated Goals Get rid of my pain so i can have a better quality of life.   Currently in Pain? Yes   Pain Score 4    Pain Location  Back   Pain Orientation Right;Lower   Pain Descriptors / Indicators Dull;Throbbing   Pain Type Chronic pain   Pain Radiating Towards right buttock   Pain Onset More than a month ago   Aggravating Factors  prolong standing or activity   Pain Relieving Factors at rest                         [facility] Adult PT Treatment/Exercise - 05/31/16 0001      Moist Heat Therapy   Number Minutes Moist Heat 15 Minutes   Moist Heat Location Lumbar Spine     Electrical Stimulation   Electrical Stimulation Location right LB/Glut   Electrical Stimulation Action IFC   Electrical Stimulation Parameters 80-_0    Electrical Stimulation Goals Pain     Traction   Type of Traction Lumbar   Min (lbs) 5   Max (lbs) 115   Hold Time 99   Rest Time 5   Time 15                PT Education - 05/31/16 1115    Education Details HEP   Person(s) Educated Patient   Methods Explanation;Demonstration;Handout   Comprehension Verbalized understanding;Returned demonstration  PT Short Term Goals - 04/16/16 1014      PT SHORT TERM GOAL #1   Title Ind with initial HEP.   Time 2   Period Weeks   Status Achieved           PT Long Term Goals - 05/31/16 1100      PT LONG TERM GOAL #1   Title Ind with an advanced HEP.   Time 8   Period Weeks   Status Achieved     PT LONG TERM GOAL #2   Title Sit 30 minutes with pain not > 3-4/10.   Period Weeks   Status Achieved     PT LONG TERM GOAL #3   Title Stand 30 minutes with pain not > 3-4/10.   Time 8   Period Weeks   Status Not Met  4+/10 05/31/16     PT LONG TERM GOAL #4   Title Perform ADL's with pain not > 4/10.   Time 8   Period Weeks   Status Not Met  4+/10 05/31/16     PT LONG TERM GOAL #5   Title Eliminate right LE pain.   Time 8   Period Weeks   Status Not Met               Plan - 05/31/16 1107    Clinical Impression Statement Patient would like today to be last day and on hold until she  talks to MD due to temporary improvement. Patient unable to meet all current goals due to pain deficit with standing or increased activity. FOTO 57% limitation (initial 59%)   Rehab Potential Good   Clinical Impairments Affecting Rehab Potential patient weight 226   PT Frequency 2x / week   PT Duration 8 weeks   PT Treatment/Interventions ADLs/Self Care Home Management;Cryotherapy;Electrical Stimulation;Moist Heat;Therapeutic exercise;Therapeutic activities;Ultrasound;Traction;Patient/family education;Manual techniques   PT Next Visit Plan on hold per patient request   Consulted and Agree with Plan of Care Patient      Patient will benefit from skilled therapeutic intervention in order to improve the following deficits and impairments:  Decreased activity tolerance, Pain, Decreased range of motion  Visit Diagnosis: Right-sided low back pain with right-sided sciatica  Abnormal posture     Problem List There are no active problems to display for this patient.   Phillips Climes 05/31/2016, 11:18 AM  [facility] [place], [place], [address] Phone: [phone]   Fax:  [phone]  Name: Sheena Marquez MRN: [id number] Date of Birth: 1951-03-20 PHYSICAL THERAPY DISCHARGE SUMMARY  Visits from Start of Care: 16.  Current functional level related to goals / functional outcomes: See above.   Remaining deficits: Good progress overall though patient continues to have LBP and right LE pain.   Education / Equipment: HEP. Plan: Patient agrees to discharge.  Patient goals were partially met. Patient is being discharged due to being pleased with the current functional level.  ?????         Mali Applegate MPT

## 2016-05-31 NOTE — Patient Instructions (Addendum)
Pelvic Tilt: Posterior - Legs Bent (Supine)   Tighten stomach and flatten back by rolling pelvis down. Hold _10___ seconds. Relax. Repeat _10-30___ times per set. Do __2__ sets per session. Do _2___ sessions per day.   Brushing Teeth    Place one foot on ledge and one hand on counter. Bend other knee slightly to keep back straight.  Copyright  VHI. All rights reserved.  Refrigerator   Squat with knees apart to reach lower shelves and drawers.   Copyright  VHI. All rights reserved.  Laundry Basket   Squat down and hold basket close to stand. Use leg muscles to do the work.   Copyright  VHI. All rights reserved.  Housework - Vacuuming   Hold the vacuum with arm held at side. Step back and forth to move it, keeping head up. Avoid twisting.   Copyright  VHI. All rights reserved.  Housework - Wiping   Position yourself as close as possible to reach work surface. Avoid straining your back.     Sleeping on Side   Place pillow between knees. Use cervical support under neck and a roll around waist as needed.   Copyright  VHI. All rights reserved.  Log Roll   Lying on back, bend left knee and place left arm across chest. Roll all in one movement to the right. Reverse to roll to the left. Always move as one unit.   Copyright  VHI. All rights reserved.  Stand to Sit / Sit to Stand   To sit: Bend knees to lower self onto front edge of chair, then scoot back on seat. To stand: Reverse sequence by placing one foot forward, and scoot to front of seat. Use rocking motion to stand up.  Copyright  VHI. All rights reserved.  Posture - Standing   Good posture is important. Avoid slouching and forward head thrust. Maintain curve in low back and align ears over shoul- ders, hips over ankles.   Copyright  VHI. All rights reserved.  Posture - Sitting   Sit upright, head facing forward. Try using a roll to support lower back. Keep shoulders relaxed, and avoid  rounded back. Keep hips level with knees. Avoid crossing legs for long periods.       

## 2016-06-05 ENCOUNTER — Encounter: Payer: Medicare Other | Admitting: *Deleted

## 2016-06-07 ENCOUNTER — Encounter: Payer: Medicare Other | Admitting: *Deleted

## 2019-04-07 ENCOUNTER — Encounter: Payer: Self-pay | Admitting: Physical Therapy

## 2019-04-07 ENCOUNTER — Other Ambulatory Visit: Payer: Self-pay

## 2019-04-07 ENCOUNTER — Ambulatory Visit: Payer: Medicare Other | Attending: Internal Medicine | Admitting: Physical Therapy

## 2019-04-07 DIAGNOSIS — M25551 Pain in right hip: Secondary | ICD-10-CM | POA: Diagnosis not present

## 2019-04-07 DIAGNOSIS — M25671 Stiffness of right ankle, not elsewhere classified: Secondary | ICD-10-CM

## 2019-04-07 NOTE — Therapy (Signed)
[facility] [address] [place], [place], [address] Phone: [phone]   Fax:  [phone]  Physical Therapy Evaluation  Patient Details  Name: Valerie SaltsBonnie S Eberly MRN: [id number] Date of Birth: [birth year] Referring Provider (PT): Legrand Ramshristina Rinaldi   Encounter Date: 04/07/2019  PT End of Session - 04/07/19 1224    Visit Number  1    Number of Visits  12    Date for PT Re-Evaluation  05/19/19    Authorization Type  FOTO AT LEAST EVERY 5TH VISIT.  PROGRESS NOTE AT 10TH VISIT.  KX MODIFIER AFTER 15 VISITS.    PT Start Time  1034    PT Stop Time  1128    PT Time Calculation (min)  54 min    Activity Tolerance  Patient tolerated treatment well    Behavior During Therapy  WFL for tasks assessed/performed       Past Medical History:  Diagnosis Date  . Anxiety    panic attacks  . Asthma 5-6 yrs. ago   asthma attack 5-6 yrs. ago [facility]  . Bulging disc    pt. states she has 3 bulging disc  . COPD (chronic obstructive pulmonary disease) ([facility])   . Diabetes mellitus without complication ([facility])   . Fracture of coccyx ([facility]) 12/2012  . Hypertension   . Shortness of breath   . Sleep apnea     Past Surgical History:  Procedure Laterality Date  . ABDOMINAL HYSTERECTOMY    . MULTIPLE EXTRACTIONS WITH ALVEOLOPLASTY N/A 09/07/2013   Procedure: MULTIPLE EXTRACION WITH ALVEOLOPLASTY;  Surgeon: Georgia LopesScott M Jensen, DDS;  Location: [facility];  Service: Oral Surgery;  Laterality: N/A;  . SKIN CANCER EXCISION     head, upper lip, right hand    There were no vitals filed for this visit.   Subjective Assessment - 04/07/19 1201    Subjective  COVID-19 screen performed prior to patient entering clinic.  The patient presents to the clinic today with c/o right hip pain that has been worsening for quite sometime.  She reports the pain limits her ability to walk long distances and sit for longer periods of time.  Lying down decreases her pain.  She  feels pain in the region of her right buttock and on occasions experiences symptoms into her right lateral thigh.    Pertinent History  Fx. of coocyx, DM, COPD, Lumbar fusion, sleep apnea.    How long can you sit comfortably?  10-15 minutes.    How long can you walk comfortably?  Couple ofblocks.    Patient Stated Goals  Walk without pain.    Currently in Pain?  Yes    Pain Score  7     Pain Location  Buttocks    Pain Orientation  Right    Pain Descriptors / Indicators  Aching;Throbbing    Pain Type  Chronic pain    Pain Onset  More than a month ago    Pain Frequency  Constant    Aggravating Factors   See above.    Pain Relieving Factors  See above.         [facility] Assessment - 04/07/19 0001      Assessment   Medical Diagnosis  Right hip pain.    Referring Provider (PT)  Legrand Ramshristina Rinaldi    Onset Date/Surgical Date  --   Ongoing.     Precautions   Precautions  None      Restrictions   Weight  Bearing Restrictions  No      Balance Screen   Has the patient fallen in the past 6 months  No    Has the patient had a decrease in activity level because of a fear of falling?   Yes    Is the patient reluctant to leave their home because of a fear of falling?   Yes      Home Environment   Living Environment  Private residence      Prior Function   Level of Independence  Independent      Observation/Other Assessments   Focus on Therapeutic Outcomes (FOTO)   72% limitation.      Deep Tendon Reflexes   DTR Assessment Site  Patella;Achilles    Patella DTR  2+    Achilles DTR  1+      ROM / Strength   AROM / PROM / Strength  AROM;Strength      AROM   AROM Assessment Site  --   Right hip IR= 10 degrees.     Strength   Overall Strength Comments  Normal right LE strength.      Palpation   Palpation comment  Tender to palption over right Piriformis.      Special Tests   Other special tests  (=) leg lengths.      Ambulation/Gait   Gait Comments  Antalgic gait pattern.                 Objective measurements completed on examination: See above findings.      [facility] Adult PT Treatment/Exercise - 04/07/19 0001      Modalities   Modalities  Electrical Stimulation;Moist Heat      Moist Heat Therapy   Number Minutes Moist Heat  15 Minutes    Moist Heat Location  --   Right buttock.     Programme researcher, broadcasting/film/videolectrical Stimulation   Electrical Stimulation Location  Right Piriformis.    Electrical Stimulation Action  Pre-mod.    Electrical Stimulation Parameters  80-150 Hz x 15 minutes.    Electrical Stimulation Goals  Pain               PT Short Term Goals - 04/07/19 1231      PT SHORT TERM GOAL #1   Title  STG's=LTG's.        PT Long Term Goals - 04/07/19 1231      PT LONG TERM GOAL #1   Title  Ind with an HEP.    Time  6    Period  Weeks    Status  New      PT LONG TERM GOAL #2   Title  Sit 30 minutes with pain not > 3-4/10.    Time  6    Period  Weeks    Status  New      PT LONG TERM GOAL #3   Title  Walk a community distance with pain not > 2-3/10.    Time  6    Period  Weeks    Status  New      PT LONG TERM GOAL #4   Title  Perform ADL's with pain not > 2-3/10.    Time  6    Period  Weeks    Status  New             Plan - 04/07/19 1226    Clinical Impression Statement  The patient presents to O[facility] with ongoing and worsening right hip pain.  She has limited right hip IR and is very tender to palption over her right Piriformis muscle.  She cannot sit or walk for long periods of time.  Her functional mobility is impaired.  Patient will benefit from skilled physical therapy intervention to address pain and deficits.    Personal Factors and Comorbidities  Age;Comorbidity 1;Comorbidity 2    Comorbidities  Fx. of coocyx, DM, COPD, Lumbar fusion, sleep apnea.    Examination-Activity Limitations  Locomotion Level;Sit;Squat    Stability/Clinical Decision Making  Evolving/Moderate complexity    Rehab Potential  Good    PT  Frequency  2x / week    PT Duration  6 weeks    PT Treatment/Interventions  ADLs/Self Care Home Management;Cryotherapy;Electrical Stimulation;Moist Heat;Ultrasound;Therapeutic activities;Therapeutic exercise;Manual techniques;Patient/family education;Passive range of motion;Dry needling    PT Next Visit Plan  Combo e'stim/U/S to right Piriformis, STW/M, Piriformis stretch on right, SKTC stretch, electrical stimulation, dry needling.    Consulted and Agree with Plan of Care  Patient       Patient will benefit from skilled therapeutic intervention in order to improve the following deficits and impairments:  Abnormal gait, Pain, Decreased activity tolerance, Decreased range of motion  Visit Diagnosis: 1. Pain in right hip   2. Stiffness of right ankle, not elsewhere classified        Problem List There are no active problems to display for this patient.   Barby Colvard, [place] MPT 04/07/2019, 12:37 PM  [facility] [address] [place], [place], [address] Phone: [phone]   Fax:  [phone]  Name: CALLA WEDEKIND MRN: [id number] Date of Birth: [birth year]

## 2019-04-10 ENCOUNTER — Other Ambulatory Visit: Payer: Self-pay

## 2019-04-10 ENCOUNTER — Ambulatory Visit: Payer: Medicare Other | Admitting: Physical Therapy

## 2019-04-10 DIAGNOSIS — M25551 Pain in right hip: Secondary | ICD-10-CM | POA: Diagnosis not present

## 2019-04-10 DIAGNOSIS — M25671 Stiffness of right ankle, not elsewhere classified: Secondary | ICD-10-CM

## 2019-04-10 NOTE — Therapy (Signed)
[facility] [address] [place], [place], [address] Phone: [phone]   Fax:  [phone]  Physical Therapy Treatment  Patient Details  Name: Sheena Marquez MRN: [id number] Date of Birth: September 28, 1950 Referring Provider (PT): Legrand Ramshristina Rinaldi   Encounter Date: 04/10/2019  PT End of Session - 04/10/19 1149    Visit Number  2    Number of Visits  12    Date for PT Re-Evaluation  05/19/19    Authorization Type  FOTO AT LEAST EVERY 5TH VISIT.  PROGRESS NOTE AT 10TH VISIT.  KX MODIFIER AFTER 15 VISITS.    PT Start Time  1031    PT Stop Time  1122    PT Time Calculation (min)  51 min    Activity Tolerance  Patient tolerated treatment well    Behavior During Therapy  WFL for tasks assessed/performed       Past Medical History:  Diagnosis Date  . Anxiety    panic attacks  . Asthma 5-6 yrs. ago   asthma attack 5-6 yrs. ago [facility]  . Bulging disc    pt. states she has 3 bulging disc  . COPD (chronic obstructive pulmonary disease) ([facility])   . Diabetes mellitus without complication ([facility])   . Fracture of coccyx ([facility]) 12/2012  . Hypertension   . Shortness of breath   . Sleep apnea     Past Surgical History:  Procedure Laterality Date  . ABDOMINAL HYSTERECTOMY    . MULTIPLE EXTRACTIONS WITH ALVEOLOPLASTY N/A 09/07/2013   Procedure: MULTIPLE EXTRACION WITH ALVEOLOPLASTY;  Surgeon: Georgia LopesScott M Jensen, DDS;  Location: [facility];  Service: Oral Surgery;  Laterality: N/A;  . SKIN CANCER EXCISION     head, upper lip, right hand    There were no vitals filed for this visit.  Subjective Assessment - 04/10/19 1121    Subjective  COVID-19 screen performed prior to patient entering clinic.  No new complaints.    Pertinent History  Fx. of coocyx, DM, COPD, Lumbar fusion, sleep apnea.    How long can you sit comfortably?  10-15 minutes.    How long can you walk comfortably?  Couple ofblocks.    Patient Stated Goals  Walk without pain.     Currently in Pain?  Yes    Pain Score  7     Pain Location  Buttocks    Pain Orientation  Right    Pain Descriptors / Indicators  Aching;Throbbing    Pain Type  Chronic pain    Pain Onset  More than a month ago                       [facility] Adult PT Treatment/Exercise - 04/10/19 0001      Modalities   Modalities  Electrical Stimulation;Moist Heat;Ultrasound      Moist Heat Therapy   Number Minutes Moist Heat  20 Minutes    Moist Heat Location  --   Right buttock.     Programme researcher, broadcasting/film/videolectrical Stimulation   Electrical Stimulation Location  Right Piriformis.    Electrical Stimulation Action  Pre-mod.    Electrical Stimulation Parameters  80-150 Hz x 20 minutes.    Electrical Stimulation Goals  Pain      Ultrasound   Ultrasound Location  Right Piriformis region.    Ultrasound Parameters  Combo e'stim/U/S at 1.50 W/CM2 x 12 minutes.    Ultrasound Goals  Pain      Manual Therapy  Manual Therapy  Soft tissue mobilization    Soft tissue mobilization  Left sdly position with folded pillow between knees for comfort:  STW/M x 12 minutes to right Piriformis region.               PT Short Term Goals - 04/07/19 1231      PT SHORT TERM GOAL #1   Title  STG's=LTG's.        PT Long Term Goals - 04/07/19 1231      PT LONG TERM GOAL #1   Title  Ind with an HEP.    Time  6    Period  Weeks    Status  New      PT LONG TERM GOAL #2   Title  Sit 30 minutes with pain not > 3-4/10.    Time  6    Period  Weeks    Status  New      PT LONG TERM GOAL #3   Title  Walk a community distance with pain not > 2-3/10.    Time  6    Period  Weeks    Status  New      PT LONG TERM GOAL #4   Title  Perform ADL's with pain not > 2-3/10.    Time  6    Period  Weeks    Status  New            Plan - 04/10/19 1149    Clinical Impression Statement  Patient did well today.  She has continued tenderness to palption over her right Piriformis.    PT Treatment/Interventions   ADLs/Self Care Home Management;Cryotherapy;Electrical Stimulation;Moist Heat;Ultrasound;Therapeutic activities;Therapeutic exercise;Manual techniques;Patient/family education;Passive range of motion;Dry needling    PT Next Visit Plan  Combo e'stim/U/S to right Piriformis, STW/M, Piriformis stretch on right, SKTC stretch, electrical stimulation, dry needling.    Consulted and Agree with Plan of Care  Patient       Patient will benefit from skilled therapeutic intervention in order to improve the following deficits and impairments:  Abnormal gait, Pain, Decreased activity tolerance, Decreased range of motion  Visit Diagnosis: 1. Pain in right hip   2. Stiffness of right ankle, not elsewhere classified        Problem List There are no active problems to display for this patient.   , Sheena Marquez 04/10/2019, 11:51 AM  [facility] [address] [place], [place], [address] Phone: [phone]   Fax:  [phone]  Name: Sheena Marquez MRN: [id number] Date of Birth: 03-May-1951

## 2019-04-15 ENCOUNTER — Other Ambulatory Visit: Payer: Self-pay

## 2019-04-15 ENCOUNTER — Ambulatory Visit: Payer: Medicare Other | Admitting: Physical Therapy

## 2019-04-15 DIAGNOSIS — M25551 Pain in right hip: Secondary | ICD-10-CM | POA: Diagnosis not present

## 2019-04-15 DIAGNOSIS — M25671 Stiffness of right ankle, not elsewhere classified: Secondary | ICD-10-CM

## 2019-04-15 NOTE — Therapy (Signed)
[facility] [address] [place], [place], [address] Phone: [phone]   Fax:  [phone]  Physical Therapy Treatment  Patient Details  Name: Sheena Marquez MRN: [id number] Date of Birth: [birth year] Referring Provider (PT): Legrand Ramshristina Rinaldi   Encounter Date: 04/15/2019  PT End of Session - 04/15/19 1212    Visit Number  3    Number of Visits  12    Date for PT Re-Evaluation  05/19/19    Authorization Type  FOTO AT LEAST EVERY 5TH VISIT.  PROGRESS NOTE AT 10TH VISIT.  KX MODIFIER AFTER 15 VISITS.    PT Start Time  0945    PT Stop Time  1044    PT Time Calculation (min)  59 min       Past Medical History:  Diagnosis Date  . Anxiety    panic attacks  . Asthma 5-6 yrs. ago   [facility]  . Bulging disc    pt. states she has 3 bulging disc  . COPD (chronic obstructive pulmonary disease) (HCC)   . Diabetes mellitus without complication (HCC)   . Fracture of coccyx (HCC) 12/2012  . Hypertension   . Shortness of breath   . Sleep apnea     Past Surgical History:  Procedure Laterality Date  . ABDOMINAL HYSTERECTOMY    . MULTIPLE EXTRACTIONS WITH ALVEOLOPLASTY N/A 09/07/2013   Procedure: MULTIPLE EXTRACION WITH ALVEOLOPLASTY;  Surgeon: Georgia LopesScott M Jensen, DDS;  [facility];  Service: Oral Surgery;  Laterality: N/A;  . SKIN CANCER EXCISION     head, upper lip, right hand    There were no vitals filed for this visit.  Subjective Assessment - 04/15/19 1213    Subjective  COVID-19 screen performed prior to patient entering clinic.  Right side feeling better.  Left side hurting.  Patient to see MD for this.    Pertinent History  Fx. of coocyx, DM, COPD, Lumbar fusion, sleep apnea.    How long can you sit comfortably?  10-15 minutes.    How long can you walk comfortably?  Couple ofblocks.    Patient Stated Goals  Walk without pain.    Currently in Pain?  Yes    Pain Score  6     Pain  Location  Buttocks    Pain Orientation  Right    Pain Descriptors / Indicators  Aching;Throbbing    Pain Type  Chronic pain    Pain Onset  More than a month ago                       [facility] - 04/15/19 0001      Modalities   Modalities  Electrical Stimulation;Moist Heat      Moist Heat Therapy   Number Minutes Moist Heat  20 Minutes    Moist Heat Location  --   Right buttock.     Programme researcher, broadcasting/film/videolectrical Stimulation   Electrical Stimulation Location  RT Piriformis.    Electrical Stimulation Action  Pre-mod.    Electrical Stimulation Parameters  80-150 Hz x 20 minutes.    Electrical Stimulation Goals  Pain      Ultrasound   Ultrasound Location  RT Piriformis.    Ultrasound Parameters  Combo e'stim/U/S at 1.50 W/CM2 x 12 minutes.      Manual Therapy   Manual Therapy  Soft tissue mobilization    Soft tissue mobilization  STW/M x  12 minutes with left Piriformis release technique.               PT Short Term Goals - 04/07/19 1231      PT SHORT TERM GOAL #1   Title  STG's=LTG's.        PT Long Term Goals - 04/07/19 1231      PT LONG TERM GOAL #1   Title  Ind with an HEP.    Time  6    Period  Weeks    Status  New      PT LONG TERM GOAL #2   Title  Sit 30 minutes with pain not > 3-4/10.    Time  6    Period  Weeks    Status  New      PT LONG TERM GOAL #3   Title  Walk a community distance with pain not > 2-3/10.    Time  6    Period  Weeks    Status  New      PT LONG TERM GOAL #4   Title  Perform ADL's with pain not > 2-3/10.    Time  6    Period  Weeks    Status  New            Plan - 04/15/19 1215    Clinical Impression Statement  Right buttock region improving though she still has continud palpable tenderness over her right Piriformis.  She is c/o left hip pain and was tender to palption over her left greater trochanter.    Personal Factors and Comorbidities  Age;Comorbidity 1;Comorbidity 2    Comorbidities   Fx. of coocyx, DM, COPD, Lumbar fusion, sleep apnea.    Examination-Activity Limitations  Locomotion Level;Sit;Squat    Stability/Clinical Decision Making  Evolving/Moderate complexity    Rehab Potential  Good    PT Frequency  2x / week    PT Duration  6 weeks    PT Treatment/Interventions  ADLs/Self Care Home Management;Cryotherapy;Electrical Stimulation;Moist Heat;Ultrasound;Therapeutic activities;Therapeutic exercise;Manual techniques;Patient/family education;Passive range of motion;Dry needling    PT Next Visit Plan  Combo e'stim/U/S to right Piriformis, STW/M, Piriformis stretch on right, SKTC stretch, electrical stimulation, dry needling.    Consulted and Agree with Plan of Care  Patient       Patient will benefit from skilled therapeutic intervention in order to improve the following deficits and impairments:  Abnormal gait, Pain, Decreased activity tolerance, Decreased range of motion  Visit Diagnosis: 1. Pain in right hip   2. Stiffness of right ankle, not elsewhere classified        Problem List There are no active problems to display for this patient.   APPLEGATE, [place] MPT 04/15/2019, 12:17 PM  [facility] [address] [place], [place], [address] Phone: [phone]   Fax:  [phone]  Name: Sheena Marquez MRN: [id number] Date of Birth: [birth year]

## 2019-04-17 ENCOUNTER — Encounter: Payer: Self-pay | Admitting: Physical Therapy

## 2019-04-17 ENCOUNTER — Ambulatory Visit: Payer: Medicare Other | Admitting: Physical Therapy

## 2019-04-17 ENCOUNTER — Other Ambulatory Visit: Payer: Self-pay

## 2019-04-17 DIAGNOSIS — M25551 Pain in right hip: Secondary | ICD-10-CM

## 2019-04-17 DIAGNOSIS — M25671 Stiffness of right ankle, not elsewhere classified: Secondary | ICD-10-CM

## 2019-04-17 NOTE — Therapy (Signed)
[facility] [place], [place], [address] Phone: [phone]   Fax:  [phone]  Physical Therapy Treatment  Patient Details  Name: Sheena Marquez MRN: [id number] Date of Birth: 05-07-1951 Referring Provider (PT): Layla Barter   Encounter Date: 04/17/2019  PT End of Session - 04/17/19 1018    Visit Number  4    Number of Visits  12    Date for PT Re-Evaluation  05/19/19    Authorization Type  FOTO AT LEAST EVERY 5TH VISIT.  PROGRESS NOTE AT 10TH VISIT.  KX MODIFIER AFTER 15 VISITS.    PT Start Time  0900    PT Stop Time  0953    PT Time Calculation (min)  53 min    Activity Tolerance  Patient tolerated treatment well    Behavior During Therapy  WFL for tasks assessed/performed       Past Medical History:  Diagnosis Date  . Anxiety    panic attacks  . Asthma 5-6 yrs. ago   asthma attack 5-6 yrs. ago [facility] in ICU 2 weeks  . Bulging disc    pt. states she has 3 bulging disc  . COPD (chronic obstructive pulmonary disease) ([place])   . Diabetes mellitus without complication ([place])   . Fracture of coccyx ([place]) 12/2012  . Hypertension   . Shortness of breath   . Sleep apnea     Past Surgical History:  Procedure Laterality Date  . ABDOMINAL HYSTERECTOMY    . MULTIPLE EXTRACTIONS WITH ALVEOLOPLASTY N/A 09/07/2013   Procedure: MULTIPLE EXTRACION WITH ALVEOLOPLASTY;  Surgeon: Gae Bon, DDS;  Location: [facility];  Service: Oral Surgery;  Laterality: N/A;  . SKIN CANCER EXCISION     head, upper lip, right hand    There were no vitals filed for this visit.  Subjective Assessment - 04/17/19 1017    Subjective  COVID-19 screen performed prior to patient entering clinic.  Reports feeling about the same. Pain varies in right glute depending on activity. Today 2/10 with sitting and 8/10 with walking    Pertinent History  Fx. of coocyx, DM, COPD, Lumbar fusion, sleep apnea.    How long can you sit comfortably?  10-15  minutes.    How long can you walk comfortably?  Couple ofblocks.    Patient Stated Goals  Walk without pain.    Currently in Pain?  Yes   see subjective   Pain Orientation  Right    Pain Descriptors / Indicators  Aching;Throbbing;Tingling    Pain Type  Chronic pain    Pain Onset  More than a month ago    Pain Frequency  Constant         [facility] PT Assessment - 04/17/19 0001      Assessment   Medical Diagnosis  Right hip pain.    Referring Provider (PT)  Layla Barter      Precautions   Precautions  None                   [facility] Adult PT Treatment/Exercise - 04/17/19 0001      Modalities   Modalities  Electrical Stimulation;Moist Heat      Moist Heat Therapy   Number Minutes Moist Heat  20 Minutes    Moist Heat Location  Other (comment)   right buttock     Electrical Stimulation   Electrical Stimulation Location  RT Piriformis.    Scientist, forensic  Stimulation Parameters  80-150 hz x10 mins    Electrical Stimulation Goals  Pain      Ultrasound   Ultrasound Location  right piriformis    Ultrasound Parameters  combo e-stim/US; 1.5 w/cm2, 100%, 1mhz    Ultrasound Goals  Pain      Manual Therapy   Manual Therapy  Soft tissue mobilization    Soft tissue mobilization  STW/M to right piriformis to decrease pain               PT Short Term Goals - 04/07/19 1231      PT SHORT TERM GOAL #1   Title  STG's=LTG's.        PT Long Term Goals - 04/07/19 1231      PT LONG TERM GOAL #1   Title  Ind with an HEP.    Time  6    Period  Weeks    Status  New      PT LONG TERM GOAL #2   Title  Sit 30 minutes with pain not > 3-4/10.    Time  6    Period  Weeks    Status  New      PT LONG TERM GOAL #3   Title  Walk a community distance with pain not > 2-3/10.    Time  6    Period  Weeks    Status  New      PT LONG TERM GOAL #4   Title  Perform ADL's with pain not > 2-3/10.    Time  6    Period  Weeks     Status  New            Plan - 04/17/19 1019    Clinical Impression Statement  Patient was able to tolerate treatment well and denied any pain with left sidelying as she reported earlier that she is having increased left hip discomfort. Patient responded well to STW/M but still has ongoing tingling. Normal affects upon removal of modalities.    Personal Factors and Comorbidities  Age;Comorbidity 1;Comorbidity 2    Comorbidities  Fx. of coocyx, DM, COPD, Lumbar fusion, sleep apnea.    Examination-Activity Limitations  Locomotion Level;Sit;Squat    Stability/Clinical Decision Making  Evolving/Moderate complexity    Rehab Potential  Good    PT Frequency  2x / week    PT Duration  6 weeks    PT Treatment/Interventions  ADLs/Self Care Home Management;Cryotherapy;Electrical Stimulation;Moist Heat;Ultrasound;Therapeutic activities;Therapeutic exercise;Manual techniques;Patient/family education;Passive range of motion;Dry needling    PT Next Visit Plan  Combo e'stim/U/S to right Piriformis, STW/M, Piriformis stretch on right, SKTC stretch, electrical stimulation, dry needling.    Consulted and Agree with Plan of Care  Patient       Patient will benefit from skilled therapeutic intervention in order to improve the following deficits and impairments:  Abnormal gait, Pain, Decreased activity tolerance, Decreased range of motion  Visit Diagnosis: 1. Pain in right hip   2. Stiffness of right ankle, not elsewhere classified        Problem List There are no active problems to display for this patient.  Guss BundeKrystle Angelin Cutrone, PT, DPT 04/17/2019, 10:22 AM  [facility] [address] [place], [place], [address] Phone: [phone]   Fax:  [phone]  Name: Sheena Marquez MRN: [id number] Date of Birth: October 27, 1950

## 2019-04-21 ENCOUNTER — Ambulatory Visit: Payer: Medicare Other | Admitting: Physical Therapy

## 2019-04-21 ENCOUNTER — Other Ambulatory Visit: Payer: Self-pay

## 2019-04-21 DIAGNOSIS — M25671 Stiffness of right ankle, not elsewhere classified: Secondary | ICD-10-CM

## 2019-04-21 DIAGNOSIS — M25551 Pain in right hip: Secondary | ICD-10-CM

## 2019-04-21 NOTE — Therapy (Signed)
[facility] [place], [place], [address] Phone: [phone]   Fax:  [phone]  Physical Therapy Treatment  Patient Details  Name: Sheena Marquez MRN: [id number] Date of Birth: 10/27/1950 Referring Provider (PT): Sheena Marquez   Encounter Date: 04/21/2019  PT End of Session - 04/21/19 1136    Visit Number  5    Number of Visits  12    Date for PT Re-Evaluation  05/19/19    Authorization Type  FOTO AT LEAST EVERY 5TH VISIT.  PROGRESS NOTE AT 10TH VISIT.  KX MODIFIER AFTER 15 VISITS.    PT Start Time  0945    PT Stop Time  1043    PT Time Calculation (min)  58 min    Activity Tolerance  Patient tolerated treatment well    Behavior During Therapy  WFL for tasks assessed/performed       Past Medical History:  Diagnosis Date  . Anxiety    panic attacks  . Asthma 5-6 yrs. ago   asthma attack 5-6 yrs. ago [facility] in ICU 2 weeks  . Bulging disc    pt. states she has 3 bulging disc  . COPD (chronic obstructive pulmonary disease) ([place])   . Diabetes mellitus without complication ([place])   . Fracture of coccyx ([place]) 12/2012  . Hypertension   . Shortness of breath   . Sleep apnea     Past Surgical History:  Procedure Laterality Date  . ABDOMINAL HYSTERECTOMY    . MULTIPLE EXTRACTIONS WITH ALVEOLOPLASTY N/A 09/07/2013   Procedure: MULTIPLE EXTRACION WITH ALVEOLOPLASTY;  Surgeon: Gae Bon, DDS;  Location: [place];  Service: Oral Surgery;  Laterality: N/A;  . SKIN CANCER EXCISION     head, upper lip, right hand    There were no vitals filed for this visit.  Subjective Assessment - 04/21/19 1105    Subjective  COVID-19 screen performed prior to patient entering clinic.  About the same.    Pertinent History  Fx. of coocyx, DM, COPD, Lumbar fusion, sleep apnea.    How long can you sit comfortably?  10-15 minutes.    How long can you walk comfortably?  Couple of blocks.    Patient Stated Goals  Walk without pain.     Currently in Pain?  Yes    Pain Score  6     Pain Descriptors / Indicators  Aching;Throbbing;Tingling    Pain Type  Chronic pain    Pain Onset  More than a month ago                       [facility] Adult PT Treatment/Exercise - 04/21/19 0001      Exercises   Exercises  Knee/Hip      Knee/Hip Exercises: Aerobic   Nustep  Level 4 x 10 minutes.      Moist Heat Therapy   Number Minutes Moist Heat  20 Minutes    Moist Heat Location  --   Low back.     Acupuncturist Location  Lower back/upper buttocks.    Electrical Stimulation Action  Pre-mod.    Electrical Stimulation Parameters  80-150 Hz x 20 minutes.    Electrical Stimulation Goals  Pain      Manual Therapy   Manual Therapy  Soft tissue mobilization    Soft tissue mobilization  STW/M x 13 minutes to bilateral low back/buttocks region with bilateral QL release technique.  PT Short Term Goals - 04/07/19 1231      PT SHORT TERM GOAL #1   Title  STG's=LTG's.        PT Long Term Goals - 04/07/19 1231      PT LONG TERM GOAL #1   Title  Ind with an HEP.    Time  6    Period  Weeks    Status  New      PT LONG TERM GOAL #2   Title  Sit 30 minutes with pain not > 3-4/10.    Time  6    Period  Weeks    Status  New      PT LONG TERM GOAL #3   Title  Walk a community distance with pain not > 2-3/10.    Time  6    Period  Weeks    Status  New      PT LONG TERM GOAL #4   Title  Perform ADL's with pain not > 2-3/10.    Time  6    Period  Weeks    Status  New            Plan - 04/21/19 1110    Clinical Impression Statement  Part of treatment focused on low back QL release technique.  Following treatment she stated it was the best she had felt.  Possible referral symptoms to buttocks and hips stemming from low back musculature.    Comorbidities  Fx. of coocyx, DM, COPD, Lumbar fusion, sleep apnea.    Stability/Clinical Decision Making   Evolving/Moderate complexity    PT Treatment/Interventions  ADLs/Self Care Home Management;Cryotherapy;Electrical Stimulation;Moist Heat;Ultrasound;Therapeutic activities;Therapeutic exercise;Manual techniques;Patient/family education;Passive range of motion;Dry needling    PT Next Visit Plan  Combo e'stim/U/S to right Piriformis, STW/M, Piriformis stretch on right, SKTC stretch, electrical stimulation, dry needling.    Consulted and Agree with Plan of Care  Patient       Patient will benefit from skilled therapeutic intervention in order to improve the following deficits and impairments:  Abnormal gait, Pain, Decreased activity tolerance, Decreased range of motion  Visit Diagnosis: 1. Pain in right hip   2. Stiffness of right ankle, not elsewhere classified        Problem List There are no active problems to display for this patient.   Sheena Marquez, [name] 04/21/2019, 11:36 AM  [facility] [address] [place], [place], [address] Phone: [phone]   Fax:  [phone]  Name: Sheena Marquez MRN: [id number] Date of Birth: 07/21/1951

## 2019-04-24 ENCOUNTER — Ambulatory Visit: Payer: Medicare Other | Admitting: Physical Therapy

## 2019-04-24 ENCOUNTER — Other Ambulatory Visit: Payer: Self-pay

## 2019-04-24 DIAGNOSIS — M25551 Pain in right hip: Secondary | ICD-10-CM | POA: Diagnosis not present

## 2019-04-24 NOTE — Therapy (Signed)
[facility] [address] [place], [place], [address] Phone: [phone]   Fax:  [phone]  Physical Therapy Treatment  Patient Details  Name: Sheena SaltsBonnie S Buelna MRN: [id number] Date of Birth: 1951/05/03 Referring Provider (PT): Legrand Ramshristina Rinaldi   Encounter Date: 04/24/2019  PT End of Session - 04/24/19 1226    Visit Number  6    Number of Visits  12    Date for PT Re-Evaluation  05/19/19    Authorization Type  FOTO AT LEAST EVERY 5TH VISIT.  PROGRESS NOTE AT 10TH VISIT.  KX MODIFIER AFTER 15 VISITS.    PT Start Time  0945    PT Stop Time  1044    PT Time Calculation (min)  59 min    Activity Tolerance  Patient tolerated treatment well    Behavior During Therapy  WFL for tasks assessed/performed       Past Medical History:  Diagnosis Date  . Anxiety    panic attacks  . Asthma 5-6 yrs. ago   [facility]th[facility]rs. [facility]  . Bulging disc    pt. states she has 3 bulging disc  . COPD (chronic obstructive pulmonary disease) (HCC)   . Diabetes mellitus without complication (HCC)   . Fracture of coccyx (HCC) 12/2012  . Hypertension   . Shortness of breath   . Sleep apnea     Past Surgical History:  Procedure Laterality Date  . ABDOMINAL HYSTERECTOMY    . MULTIPLE EXTRACTIONS WITH ALVEOLOPLASTY N/A 09/07/2013   Procedure: MULTIPLE EXTRACION WITH ALVEOLOPLASTY;  Surgeon: Georgia LopesScott M Jensen, DDS;  Location: [facility];  Service: Oral Surgery;  Laterality: N/A;  . SKIN CANCER EXCISION     head, upper lip, right hand    There were no vitals filed for this visit.  Subjective Assessment - 04/24/19 1218    Subjective  COVID-19 screen performed prior to patient entering clinic.  The last treatment helped a lot.  I was able to walk a lot.  Symptoms into left hip and right buttock came back later on.    Pertinent History  Fx. of coocyx, DM, COPD, Lumbar fusion, sleep apnea.    How long can you sit comfortably?  10-15  minutes.    How long can you walk comfortably?  Couple of blocks.    Currently in Pain?  Yes    Pain Score  6     Pain Location  Buttocks    Pain Orientation  Right    Pain Descriptors / Indicators  Aching;Throbbing;Tingling    Pain Type  Chronic pain    Pain Onset  More than a month ago                       [facility]/Exercise - 04/24/19 0001      Modalities   Modalities  Electrical Stimulation;Moist Heat;Ultrasound      Moist Heat Therapy   Number Minutes Moist Heat  20 Minutes    Moist Heat Location  Lumbar Spine      Electrical Stimulation   Electrical Stimulation Location  Low back.    Electrical Stimulation Action  Pre-mod.    Electrical Stimulation Parameters  80-150 Hz x 20 minutes.    Electrical Stimulation Goals  Pain      Ultrasound   Ultrasound Location  Blateral low back.    Ultrasound Parameters  Combo e'stim/U/S at 1.50 W/CM2 x 12 minutes.  Ultrasound Goals  Pain      Manual Therapy   Manual Therapy  Soft tissue mobilization    Soft tissue mobilization  STW/M x 14 minutes to bilateral low back including bilateral QL release technique to reduce tone.               PT Short Term Goals - 04/07/19 1231      PT SHORT TERM GOAL #1   Title  STG's=LTG's.        PT Long Term Goals - 04/07/19 1231      PT LONG TERM GOAL #1   Title  Ind with an HEP.    Time  6    Period  Weeks    Status  New      PT LONG TERM GOAL #2   Title  Sit 30 minutes with pain not > 3-4/10.    Time  6    Period  Weeks    Status  New      PT LONG TERM GOAL #3   Title  Walk a community distance with pain not > 2-3/10.    Time  6    Period  Weeks    Status  New      PT LONG TERM GOAL #4   Title  Perform ADL's with pain not > 2-3/10.    Time  6    Period  Weeks    Status  New            Plan - 04/24/19 1223    Clinical Impression Statement  Patient has had a good response to the last two treatments with more focus on her  lumbar region.  It appears at least some of her buttock and left hip symptoms are referring from her lumbar spine.    Personal Factors and Comorbidities  Age;Comorbidity 1;Comorbidity 2    Comorbidities  Fx. of coocyx, DM, COPD, Lumbar fusion, sleep apnea.    Examination-Activity Limitations  Locomotion Level;Sit;Squat    Stability/Clinical Decision Making  Evolving/Moderate complexity    Rehab Potential  Good    PT Frequency  2x / week    PT Duration  6 weeks    PT Treatment/Interventions  ADLs/Self Care Home Management;Cryotherapy;Electrical Stimulation;Moist Heat;Ultrasound;Therapeutic activities;Therapeutic exercise;Manual techniques;Patient/family education;Passive range of motion;Dry needling    PT Next Visit Plan  Combo e'stim/U/S to right Piriformis, STW/M, Piriformis stretch on right, SKTC stretch, electrical stimulation, dry needling.    Consulted and Agree with Plan of Care  Patient       Patient will benefit from skilled therapeutic intervention in order to improve the following deficits and impairments:  Abnormal gait, Pain, Decreased activity tolerance, Decreased range of motion  Visit Diagnosis: 1. Pain in right hip        Problem List There are no active problems to display for this patient.   Khalee Mazo, [place] MPT 04/24/2019, 12:27 PM  [facility] [address] [place], [place], [address] Phone: [phone]   Fax:  [phone]  Name: Sheena Marquez MRN: [id number] Date of Birth: [birth year]

## 2019-04-28 ENCOUNTER — Ambulatory Visit: Payer: Medicare Other | Attending: Internal Medicine | Admitting: Physical Therapy

## 2019-04-28 ENCOUNTER — Other Ambulatory Visit: Payer: Self-pay

## 2019-04-28 DIAGNOSIS — M25551 Pain in right hip: Secondary | ICD-10-CM | POA: Diagnosis present

## 2019-04-28 DIAGNOSIS — M25671 Stiffness of right ankle, not elsewhere classified: Secondary | ICD-10-CM

## 2019-04-28 NOTE — Therapy (Signed)
[facility] [address] [place], [place], [address] Phone: [phone]   Fax:  [phone]  Physical Therapy Treatment  Patient Details  Name: Sheena SaltsBonnie S Marquez MRN: [id number] Date of Birth: 1951/08/13 Referring Provider (PT): Legrand Ramshristina Rinaldi   Encounter Date: 04/28/2019  PT End of Session - 04/28/19 0949    Visit Number  7    Number of Visits  12    Date for PT Re-Evaluation  05/19/19    Authorization Type  FOTO AT LEAST EVERY 5TH VISIT.  PROGRESS NOTE AT 10TH VISIT.  KX MODIFIER AFTER 15 VISITS.    PT Start Time  [phone]    PT Stop Time  1040    PT Time Calculation (min)  57 min    Activity Tolerance  Patient tolerated treatment well    Behavior During Therapy  WFL for tasks assessed/performed       Past Medical History:  Diagnosis Date  . Anxiety    panic attacks  . Asthma 5-6 yrs. ago   asthma attack 5-6 yrs. ago [facility]  . Bulging disc    pt. states she has 3 bulging disc  . COPD (chronic obstructive pulmonary disease) (HCC)   . Diabetes mellitus without complication (HCC)   . Fracture of coccyx (HCC) 12/2012  . Hypertension   . Shortness of breath   . Sleep apnea     Past Surgical History:  Procedure Laterality Date  . ABDOMINAL HYSTERECTOMY    . MULTIPLE EXTRACTIONS WITH ALVEOLOPLASTY N/A 09/07/2013   Procedure: MULTIPLE EXTRACION WITH ALVEOLOPLASTY;  Surgeon: Georgia LopesScott M Jensen, DDS;  Location: [facility] OR;  Service: Oral Surgery;  Laterality: N/A;  . SKIN CANCER EXCISION     head, upper lip, right hand    There were no vitals filed for this visit.  Subjective Assessment - 04/28/19 0951    Subjective  COVID-19 screen performed prior to patient entering clinic.  Discoraged.    Pertinent History  Fx. of coocyx, DM, COPD, Lumbar fusion, sleep apnea.    How long can you sit comfortably?  10-15 minutes.    How long can you walk comfortably?  Couple of blocks.    Patient Stated Goals  Walk without pain.     Currently in Pain?  Yes    Pain Score  6     Pain Location  Buttocks    Pain Orientation  Right    Pain Descriptors / Indicators  Aching;Throbbing;Tingling    Pain Type  Chronic pain    Pain Onset  More than a month ago                       [facility] Adult PT Treatment/Exercise - 04/28/19 0001      Exercises   Exercises  Knee/Hip      Knee/Hip Exercises: Aerobic   Nustep  Level 4 x 15 minutes.      Modalities   Modalities  Primary school teacherlectrical Stimulation      Electrical Stimulation   Electrical Stimulation Location  Right buttock.    Electrical Stimulation Action  Pre-mod.    Electrical Stimulation Parameters  80-150 Hz x 20 minutes.    Electrical Stimulation Goals  Pain      Manual Therapy   Manual Therapy  Passive ROM    Passive ROM  In supine:  Belt mobs to right hip and PROM into right hip flexion, IR/ER and adduction with sustained holds for a total of  8 minutes.               PT Short Term Goals - 04/07/19 1231      PT SHORT TERM GOAL #1   Title  STG's=LTG's.        PT Long Term Goals - 04/07/19 1231      PT LONG TERM GOAL #1   Title  Ind with an HEP.    Time  6    Period  Weeks    Status  New      PT LONG TERM GOAL #2   Title  Sit 30 minutes with pain not > 3-4/10.    Time  6    Period  Weeks    Status  New      PT LONG TERM GOAL #3   Title  Walk a community distance with pain not > 2-3/10.    Time  6    Period  Weeks    Status  New      PT LONG TERM GOAL #4   Title  Perform ADL's with pain not > 2-3/10.    Time  6    Period  Weeks    Status  New            Plan - 04/28/19 1134    Clinical Impression Statement  Patient did well with Nustep exercise today.  She performed without pain.       Patient will benefit from skilled therapeutic intervention in order to improve the following deficits and impairments:     Visit Diagnosis: 1. Pain in right hip   2. Stiffness of right ankle, not elsewhere classified         Problem List There are no active problems to display for this patient.   Ellison Leisure, [place] MPT 04/28/2019, 11:35 AM  [facility] [address] [place], [place], [address] Phone: [phone]   Fax:  [phone]  Name: Sheena Marquez MRN: [id number] Date of Birth: 04/25/1951

## 2019-05-01 ENCOUNTER — Ambulatory Visit: Payer: Medicare Other | Admitting: Physical Therapy

## 2019-06-10 ENCOUNTER — Encounter: Payer: Medicare Other | Admitting: Physical Therapy

## 2019-06-11 ENCOUNTER — Encounter: Payer: Self-pay | Admitting: Physical Therapy

## 2019-06-11 ENCOUNTER — Other Ambulatory Visit: Payer: Self-pay

## 2019-06-11 ENCOUNTER — Ambulatory Visit: Payer: Medicare Other | Attending: Internal Medicine | Admitting: Physical Therapy

## 2019-06-11 DIAGNOSIS — M25671 Stiffness of right ankle, not elsewhere classified: Secondary | ICD-10-CM | POA: Diagnosis present

## 2019-06-11 DIAGNOSIS — M25551 Pain in right hip: Secondary | ICD-10-CM | POA: Insufficient documentation

## 2019-06-11 NOTE — Therapy (Signed)
[facility] [place], [place], [address] Phone: [phone]   Fax:  [phone]  Physical Therapy Treatment  Patient Details  Name: Sheena Marquez MRN: [id number] Date of Birth: 24-May-1951 Referring Provider (PT): Layla Barter   Encounter Date: 06/11/2019  PT End of Session - 06/11/19 1355    Visit Number  8    Number of Visits  20    Date for PT Re-Evaluation  07/24/19    Authorization Type  FOTO AT LEAST EVERY 5TH VISIT.  PROGRESS NOTE AT 10TH VISIT.  KX MODIFIER AFTER 15 VISITS.    PT Start Time  1345    PT Stop Time  1436    PT Time Calculation (min)  51 min    Activity Tolerance  Patient tolerated treatment well    Behavior During Therapy  WFL for tasks assessed/performed       Past Medical History:  Diagnosis Date  . Anxiety    panic attacks  . Asthma 5-6 yrs. ago   asthma attack 5-6 yrs. ago [facility] in ICU 2 weeks  . Bulging disc    pt. states she has 3 bulging disc  . COPD (chronic obstructive pulmonary disease) ([place])   . Diabetes mellitus without complication ([place])   . Fracture of coccyx ([place]) 12/2012  . Hypertension   . Shortness of breath   . Sleep apnea     Past Surgical History:  Procedure Laterality Date  . ABDOMINAL HYSTERECTOMY    . MULTIPLE EXTRACTIONS WITH ALVEOLOPLASTY N/A 09/07/2013   Procedure: MULTIPLE EXTRACION WITH ALVEOLOPLASTY;  Surgeon: Gae Bon, DDS;  Location: [place];  Service: Oral Surgery;  Laterality: N/A;  . SKIN CANCER EXCISION     head, upper lip, right hand    There were no vitals filed for this visit.  Subjective Assessment - 06/11/19 1352    Subjective  COVID-19 screen performed prior to patient entering clinic.  Discoraged. Patient returns to physical therapy after a hiatus due to her husband's death. Patient states she did a lot of yard work today and she's paying for it today with "moderate pain" in her glute    Pertinent History  Fx. of coocyx, DM, COPD,  Lumbar fusion, sleep apnea.    How long can you sit comfortably?  5 minutes    How long can you walk comfortably?  Couple of blocks.    Patient Stated Goals  Walk without pain.    Currently in Pain?  Yes   "moderate"        [facility] PT Assessment - 06/11/19 0001      Assessment   Medical Diagnosis  Right hip pain.    Referring Provider (PT)  Layla Barter      Precautions   Precautions  None                   [facility] Adult PT Treatment/Exercise - 06/11/19 0001      Exercises   Exercises  Knee/Hip      Modalities   Modalities  Electrical Stimulation      Electrical Stimulation   Electrical Stimulation Location  Right buttock.    Electrical Stimulation Action  premod    Electrical Stimulation Parameters  80-150 hz x10 mins    Electrical Stimulation Goals  Pain      Manual Therapy   Manual Therapy  Soft tissue mobilization    Soft tissue mobilization  STW/M and trigger point release to right piriformis and  insertion point of hamstring               PT Short Term Goals - 04/07/19 1231      PT SHORT TERM GOAL #1   Title  STG's=LTG's.        PT Long Term Goals - 06/11/19 1530      PT LONG TERM GOAL #1   Title  Ind with an HEP.    Time  6    Period  Weeks    Status  On-going      PT LONG TERM GOAL #2   Title  Sit 30 minutes with pain not > 3-4/10.    Time  6    Period  Weeks    Status  On-going      PT LONG TERM GOAL #3   Title  Walk a community distance with pain not > 2-3/10.    Time  6    Period  Weeks    Status  On-going      PT LONG TERM GOAL #4   Title  Perform ADL's with pain not > 2-3/10.    Time  6    Period  Weeks    Status  On-going            Plan - 06/11/19 1528    Clinical Impression Statement  Patient was able to tolerate treatment well today with no reports of increased pain. Patient noted with increased muscle tension in right piriformis as well as a large trigger point at the insertion point of hamstring  tendon. Patient responded well to STW/M and trigger point release. Normal response to modalites upon removal of modalities.    Personal Factors and Comorbidities  Age;Comorbidity 1;Comorbidity 2    Comorbidities  Fx. of coocyx, DM, COPD, Lumbar fusion, sleep apnea.    Examination-Activity Limitations  Locomotion Level;Sit;Squat    Stability/Clinical Decision Making  Evolving/Moderate complexity    Rehab Potential  Good    PT Frequency  2x / week    PT Duration  6 weeks    PT Treatment/Interventions  ADLs/Self Care Home Management;Cryotherapy;Electrical Stimulation;Moist Heat;Ultrasound;Therapeutic activities;Therapeutic exercise;Manual techniques;Patient/family education;Passive range of motion;Dry needling    PT Next Visit Plan  continue with POC Combo e'stim/U/S to right Piriformis, STW/M, Piriformis stretch on right, SKTC stretch, electrical stimulation, dry needling.    Consulted and Agree with Plan of Care  Patient       Patient will benefit from skilled therapeutic intervention in order to improve the following deficits and impairments:  Abnormal gait, Pain, Decreased activity tolerance, Decreased range of motion  Visit Diagnosis: Pain in right hip     Problem List There are no active problems to display for this patient.   Burnell BlanksKrystle Quince Santana 06/11/2019, 3:32 PM  [facility] [address] [place], [place], [address] Phone: [phone]   Fax:  [phone]  Name: Sheena Marquez MRN: [id number] Date of Birth: 05-Jan-1951

## 2019-06-15 ENCOUNTER — Ambulatory Visit: Payer: Medicare Other | Admitting: Physical Therapy

## 2019-06-15 ENCOUNTER — Other Ambulatory Visit: Payer: Self-pay

## 2019-06-15 ENCOUNTER — Encounter: Payer: Self-pay | Admitting: Physical Therapy

## 2019-06-15 DIAGNOSIS — M25551 Pain in right hip: Secondary | ICD-10-CM

## 2019-06-15 DIAGNOSIS — M25671 Stiffness of right ankle, not elsewhere classified: Secondary | ICD-10-CM

## 2019-06-15 NOTE — Therapy (Signed)
[facility] [address] [place], [place], [address] Phone: [phone]   Fax:  [phone]  Physical Therapy Treatment  Patient Details  Name: Sheena Marquez MRN: [id number] Date of Birth: 07/13/1951 Referring Provider (PT): Legrand Rams   Encounter Date: 06/15/2019  PT End of Session - 06/15/19 1225    Visit Number  9    Number of Visits  20    Date for PT Re-Evaluation  07/24/19    Authorization Type  FOTO AT LEAST EVERY 5TH VISIT.  PROGRESS NOTE AT 10TH VISIT.  KX MODIFIER AFTER 15 VISITS.    PT Start Time  1120    PT Stop Time  1218    PT Time Calculation (min)  58 min    Activity Tolerance  Patient tolerated treatment well    Behavior During Therapy  WFL for tasks assessed/performed       Past Medical History:  Diagnosis Date  . Anxiety    panic attacks  . Asthma 5-6 yrs. ago   asthma attack 5-6 yrs. ago [facility] in ICU 2 weeks  . Bulging disc    pt. states she has 3 bulging disc  . COPD (chronic obstructive pulmonary disease) (HCC)   . Diabetes mellitus without complication (HCC)   . Fracture of coccyx (HCC) 12/2012  . Hypertension   . Shortness of breath   . Sleep apnea     Past Surgical History:  Procedure Laterality Date  . ABDOMINAL HYSTERECTOMY    . MULTIPLE EXTRACTIONS WITH ALVEOLOPLASTY N/A 09/07/2013   Procedure: MULTIPLE EXTRACION WITH ALVEOLOPLASTY;  Surgeon: Georgia Lopes, DDS;  Location: [facility] OR;  Service: Oral Surgery;  Laterality: N/A;  . SKIN CANCER EXCISION     head, upper lip, right hand    There were no vitals filed for this visit.  Subjective Assessment - 06/15/19 1129    Subjective  COVID 19 screening performed on patient upon arrival. Reports that pain is still in R glute/proximal HS.    Pertinent History  Fx. of coocyx, DM, COPD, Lumbar fusion, sleep apnea.    How long can you sit comfortably?  5 minutes    How long can you walk comfortably?  Couple of blocks.    Patient Stated  Goals  Walk without pain.    Currently in Pain?  Yes    Pain Score  9     Pain Location  Buttocks    Pain Orientation  Right    Pain Descriptors / Indicators  Discomfort    Pain Type  Chronic pain    Pain Onset  More than a month ago    Pain Frequency  Constant    Aggravating Factors   Sitting, knee flexion    Pain Relieving Factors  Standing         [facility] PT Assessment - 06/15/19 0001      Assessment   Medical Diagnosis  Right hip pain.    Referring Provider (PT)  Legrand Rams    Hand Dominance  Left    Next MD Visit  None/PRN      Precautions   Precautions  None      Restrictions   Weight Bearing Restrictions  No                   [facility] Adult PT Treatment/Exercise - 06/15/19 0001      Knee/Hip Exercises: Aerobic   Nustep  L4 x16 min      Modalities  Modalities  Electrical Stimulation;Moist Heat      Moist Heat Therapy   Number Minutes Moist Heat  15 Minutes    Moist Heat Location  Hip      Electrical Stimulation   Electrical Stimulation Location  Right buttock.    Electrical Stimulation Action  Pre-Mod    Electrical Stimulation Parameters  80-150 hz x15 min    Electrical Stimulation Goals  Pain;Tone      Manual Therapy   Manual Therapy  Soft tissue mobilization    Soft tissue mobilization  STW/MFR to R HS attachment, Piriformis to reduce muscle tightness and pain             PT Education - 06/15/19 1249    Education Details  DN consent form    Person(s) Educated  Patient    Methods  Explanation    Comprehension  Verbalized understanding       PT Short Term Goals - 04/07/19 1231      PT SHORT TERM GOAL #1   Title  STG's=LTG's.        PT Long Term Goals - 06/11/19 1530      PT LONG TERM GOAL #1   Title  Ind with an HEP.    Time  6    Period  Weeks    Status  On-going      PT LONG TERM GOAL #2   Title  Sit 30 minutes with pain not > 3-4/10.    Time  6    Period  Weeks    Status  On-going      PT LONG TERM GOAL  #3   Title  Walk a community distance with pain not > 2-3/10.    Time  6    Period  Weeks    Status  On-going      PT LONG TERM GOAL #4   Title  Perform ADL's with pain not > 2-3/10.    Time  6    Period  Weeks    Status  On-going            Plan - 06/15/19 1249    Clinical Impression Statement  Patient presented in clinic with reports of continued R glute and into proximal thigh. Pain is greater in sitting position and knee flexion. Patient able to complete Nustep with only reports of discomfort with knee flexion with hip flexion. Patient indicating pain at proximal HS attachment today with tightness along with tightness along R piriformis. Normal modalities response noted following removal of the modalities. Patient provided DN consent form to consider for upcoming visits.    Personal Factors and Comorbidities  Age;Comorbidity 1;Comorbidity 2    Comorbidities  Fx. of coocyx, DM, COPD, Lumbar fusion, sleep apnea.    Examination-Activity Limitations  Locomotion Level;Sit;Squat    Stability/Clinical Decision Making  Evolving/Moderate complexity    Rehab Potential  Good    PT Frequency  2x / week    PT Duration  6 weeks    PT Treatment/Interventions  ADLs/Self Care Home Management;Cryotherapy;Electrical Stimulation;Moist Heat;Ultrasound;Therapeutic activities;Therapeutic exercise;Manual techniques;Patient/family education;Passive range of motion;Dry needling    PT Next Visit Plan  continue with POC Combo e'stim/U/S to right Piriformis, STW/M, Piriformis stretch on right, SKTC stretch, electrical stimulation, dry needling.    Consulted and Agree with Plan of Care  Patient       Patient will benefit from skilled therapeutic intervention in order to improve the following deficits and impairments:  Abnormal gait, Pain, Decreased activity  tolerance, Decreased range of motion  Visit Diagnosis: Pain in right hip  Stiffness of right ankle, not elsewhere classified     Problem  List There are no active problems to display for this patient.   Marvell Fuller, PTA 06/15/2019, 1:13 PM  [facility] [address] [place], [place], [address] Phone: [phone]   Fax:  [phone]  Name: Sheena Marquez MRN: [id number] Date of Birth: Aug 03, 1951

## 2019-06-15 NOTE — Patient Instructions (Signed)
[facility]  DRY NEEDLING CONSENT FORM   Trigger point dry needling is a physical therapy approach to treat Myofascial Pain and Dysfunction.  Dry Needling (DN) is a valuable and effective way to deactivate myofascial trigger points (muscle knots). It is skilled intervention that uses a thin filiform needle to penetrate the skin and stimulate underlying myofascial trigger points, muscular, and connective tissues for the management of neuromusculoskeletal pain and movement impairments.  A local twitch response (LTR) will be elicited.  This can sometimes feel like a deep ache in the muscle during the procedure. Multiple trigger points in multiple muscles can be treated during each treatment.  No medication of any kind is injected.   As with any medical treatment and procedure, there are possible adverse events.  While significant adverse events are uncommon, they do sometimes occur and must be considered prior to giving consent.  1. Dry needling often causes a "post needling soreness".  There can be an increase in pain from a couple of hours to 2-3 days, followed by an improvement in the overall pain state. 2. Any time a needle is used there is a risk of infection.  However, we are using new, sterile, and disposable needles; infections are extremely rare. 3. There is a possibility that you may bleed or bruise.  You may feel tired and some nausea following treatment. 4. There is a rare possibility of a pneumothorax (air in the chest cavity). 5. Allergic reaction to nickel in the stainless steel needle. 6. If a nerve is touched, it may cause paresthesia (a prickling/shock sensation) which is usually brief, but may continue for a couple of days.  Following treatment stay hydrated.  Continue regular activities but not too vigorous initially after treatment for 24-48 hours. You may apply heat to sore muscles.  Dry Needling is best when combined with other physical therapy  interventions such as strengthening, stretching and other therapeutic modalities.     PLEASE ANSWER THE FOLLOWING QUESTIONS:  Do you have a lack of sensation?   Y/N  Do you have a phobia or fear of needles  Y/N  Are you pregnant?    Y/N If yes:  How many weeks? _____  Do you have any implanted devices?  Y/N If yes:  Pacemaker/Spinal Cord         Stimulator/Deep Brain         Stimulator/Insulin          Pump/Other: ____________ Do you have any implants?   Y/N If yes:      Do you take any blood thinners?   Y/N If yes: Coumadin          (Warfarin)/Other:  Do you have a bleeding disorder?   Y/N If yes: What kind:   Do you take any immunosuppressants?  Y/N If yes:   What kind:   Do you take anti-inflammatories?   Y/N If yes: What kind:  Have you ever been diagnosed with Scoliosis? Y/N  Have you had back surgery?    Y/N If yes:         Laminectomy/Fusion/Other:   I have read, or had read to me, the above.  I have had the opportunity to ask any questions.  All of my questions have been answered to my satisfaction and I understand the risks involved with dry needling.  I consent to examination and treatment at [facility], including dry needling, of any and all of my involved and affected   muscles.   

## 2019-06-17 ENCOUNTER — Ambulatory Visit: Payer: Medicare Other | Admitting: Physical Therapy

## 2019-06-17 ENCOUNTER — Other Ambulatory Visit: Payer: Self-pay

## 2019-06-17 ENCOUNTER — Encounter: Payer: Self-pay | Admitting: Physical Therapy

## 2019-06-17 DIAGNOSIS — M25551 Pain in right hip: Secondary | ICD-10-CM

## 2019-06-17 DIAGNOSIS — M25671 Stiffness of right ankle, not elsewhere classified: Secondary | ICD-10-CM

## 2019-06-17 NOTE — Therapy (Signed)
[facility] [address] [place], [place], [address] Phone: [phone]   Fax:  [phone]  Physical Therapy Treatment  Patient Details  Name: Sheena Marquez MRN: [id number] Date of Birth: August 03, 1951 Referring Provider (PT): Legrand Rams   Encounter Date: 06/17/2019  PT End of Session - 06/17/19 1121    Visit Number  10    Number of Visits  20    Date for PT Re-Evaluation  07/24/19    Authorization Type  FOTO AT LEAST EVERY 5TH VISIT.  PROGRESS NOTE AT 10TH VISIT.  KX MODIFIER AFTER 15 VISITS.    PT Start Time  1117    PT Stop Time  1208    PT Time Calculation (min)  51 min    Activity Tolerance  Patient tolerated treatment well    Behavior During Therapy  WFL for tasks assessed/performed       Past Medical History:  Diagnosis Date  . Anxiety    panic attacks  . Asthma 5-6 yrs. ago   asthma attack 5-6 yrs. ago [facility] in ICU 2 weeks  . Bulging disc    pt. states she has 3 bulging disc  . COPD (chronic obstructive pulmonary disease) (HCC)   . Diabetes mellitus without complication (HCC)   . Fracture of coccyx (HCC) 12/2012  . Hypertension   . Shortness of breath   . Sleep apnea     Past Surgical History:  Procedure Laterality Date  . ABDOMINAL HYSTERECTOMY    . MULTIPLE EXTRACTIONS WITH ALVEOLOPLASTY N/A 09/07/2013   Procedure: MULTIPLE EXTRACION WITH ALVEOLOPLASTY;  Surgeon: Georgia Lopes, DDS;  Location: [facility];  Service: Oral Surgery;  Laterality: N/A;  . SKIN CANCER EXCISION     head, upper lip, right hand    There were no vitals filed for this visit.  Subjective Assessment - 06/17/19 1119    Subjective  COVID 19 screening performed on patient upon arrival. Reports that she researched the DN and is interested in it. Reports pain and tightness of the R glute/HS region is the same.    Pertinent History  Fx. of coocyx, DM, COPD, Lumbar fusion, sleep apnea.    How long can you sit comfortably?  5 minutes    How long can you walk comfortably?  Couple of blocks.    Patient Stated Goals  Walk without pain.    Currently in Pain?  Yes    Pain Score  8     Pain Location  Buttocks    Pain Orientation  Right    Pain Descriptors / Indicators  Tightness;Discomfort    Pain Type  Chronic pain    Pain Onset  More than a month ago    Pain Frequency  Constant         [facility] PT Assessment - 06/17/19 0001      Assessment   Medical Diagnosis  Right hip pain.    Referring Provider (PT)  Legrand Rams    Hand Dominance  Left    Next MD Visit  None/PRN      Precautions   Precautions  None      Restrictions   Weight Bearing Restrictions  No                   [facility] Adult PT Treatment/Exercise - 06/17/19 0001      Knee/Hip Exercises: Aerobic   Nustep  L4 x16 min      Modalities   Modalities  Electrical Stimulation;Moist Heat  Moist Heat Therapy   Number Minutes Moist Heat  10 Minutes    Moist Heat Location  Hip      Electrical Stimulation   Electrical Stimulation Location  Right buttock.    Electrical Stimulation Action  Pre-Mod    Electrical Stimulation Parameters  80-150 hz x10 min    Electrical Stimulation Goals  Pain;Tone      Manual Therapy   Manual Therapy  Soft tissue mobilization    Soft tissue mobilization  STW/MFR to R HS attachment, Piriformis to reduce muscle tightness and pain               PT Short Term Goals - 04/07/19 1231      PT SHORT TERM GOAL #1   Title  STG's=LTG's.        PT Long Term Goals - 06/11/19 1530      PT LONG TERM GOAL #1   Title  Ind with an HEP.    Time  6    Period  Weeks    Status  On-going      PT LONG TERM GOAL #2   Title  Sit 30 minutes with pain not > 3-4/10.    Time  6    Period  Weeks    Status  On-going      PT LONG TERM GOAL #3   Title  Walk a community distance with pain not > 2-3/10.    Time  6    Period  Weeks    Status  On-going      PT LONG TERM GOAL #4   Title  Perform ADL's with pain  not > 2-3/10.    Time  6    Period  Weeks    Status  On-going            Plan - 06/17/19 1204    Clinical Impression Statement  Patient presented in clinic with reports of continued R buttock/proximal HS pain. Pain especially present while sitting. Patient presents with possible bursa inflammation at R ischial tuberosity and continued tightness of the R piriformis. Normal modalities response noted following removal of the modalities.    Personal Factors and Comorbidities  Age;Comorbidity 1;Comorbidity 2    Comorbidities  Fx. of coocyx, DM, COPD, Lumbar fusion, sleep apnea.    Examination-Activity Limitations  Locomotion Level;Sit;Squat    Stability/Clinical Decision Making  Evolving/Moderate complexity    Rehab Potential  Good    PT Frequency  2x / week    PT Duration  6 weeks    PT Treatment/Interventions  ADLs/Self Care Home Management;Cryotherapy;Electrical Stimulation;Moist Heat;Ultrasound;Therapeutic activities;Therapeutic exercise;Manual techniques;Patient/family education;Passive range of motion;Dry needling    PT Next Visit Plan  continue with POC Combo e'stim/U/S to right Piriformis, STW/M, Piriformis stretch on right, SKTC stretch, electrical stimulation, dry needling.    Consulted and Agree with Plan of Care  Patient       Patient will benefit from skilled therapeutic intervention in order to improve the following deficits and impairments:  Abnormal gait, Pain, Decreased activity tolerance, Decreased range of motion  Visit Diagnosis: Pain in right hip  Stiffness of right ankle, not elsewhere classified     Problem List There are no active problems to display for this patient.   Standley Brooking, PTA 06/17/2019, 12:13 PM  [facility] [address] [place], [place], [address] Phone: [phone]   Fax:  [phone]  Name: Sheena Marquez MRN: [id number] Date of Birth: Dec 17, 1950  Progress Note  Reporting Period  04/07/19 to 06/17/19  See note below for Objective Data and Assessment of Progress/Goals. Goals are currently ongoing.  Patient had to take a 5 week hiatus from PT due to personal issues.    [place] App[place] MPT

## 2019-06-22 ENCOUNTER — Other Ambulatory Visit: Payer: Self-pay

## 2019-06-22 ENCOUNTER — Encounter: Payer: Self-pay | Admitting: Physical Therapy

## 2019-06-22 ENCOUNTER — Ambulatory Visit: Payer: Medicare Other | Admitting: Physical Therapy

## 2019-06-22 DIAGNOSIS — M25551 Pain in right hip: Secondary | ICD-10-CM | POA: Diagnosis not present

## 2019-06-22 DIAGNOSIS — M25671 Stiffness of right ankle, not elsewhere classified: Secondary | ICD-10-CM

## 2019-06-22 NOTE — Therapy (Signed)
[facility] [place], [place], [address] Phone: [phone]   Fax:  [phone]  Physical Therapy Treatment  Patient Details  Name: Sheena Marquez MRN: [id number] Date of Birth: 06/05/1951 Referring Provider (PT): Layla Barter   Encounter Date: 06/22/2019  PT End of Session - 06/22/19 1118    Visit Number  11    Number of Visits  20    Date for PT Re-Evaluation  07/24/19    Authorization Type  FOTO AT LEAST EVERY 5TH VISIT.  PROGRESS NOTE AT 10TH VISIT.  KX MODIFIER AFTER 15 VISITS.    PT Start Time  1115    PT Stop Time  1211    PT Time Calculation (min)  56 min    Activity Tolerance  Patient tolerated treatment well    Behavior During Therapy  WFL for tasks assessed/performed       Past Medical History:  Diagnosis Date  . Anxiety    panic attacks  . Asthma 5-6 yrs. ago   asthma attack 5-6 yrs. ago [facility] in ICU 2 weeks  . Bulging disc    pt. states she has 3 bulging disc  . COPD (chronic obstructive pulmonary disease) ([place])   . Diabetes mellitus without complication ([place])   . Fracture of coccyx ([place]) 12/2012  . Hypertension   . Shortness of breath   . Sleep apnea     Past Surgical History:  Procedure Laterality Date  . ABDOMINAL HYSTERECTOMY    . MULTIPLE EXTRACTIONS WITH ALVEOLOPLASTY N/A 09/07/2013   Procedure: MULTIPLE EXTRACION WITH ALVEOLOPLASTY;  Surgeon: Gae Bon, DDS;  Location: [place];  Service: Oral Surgery;  Laterality: N/A;  . SKIN CANCER EXCISION     head, upper lip, right hand    There were no vitals filed for this visit.  Subjective Assessment - 06/22/19 1117    Subjective  COVID 19 screening performed on patient upon arrival. "It's good as long as I don't sit."    Pertinent History  Fx. of coocyx, DM, COPD, Lumbar fusion, sleep apnea.    How long can you sit comfortably?  5 minutes    How long can you walk comfortably?  Couple of blocks.    Patient Stated Goals  Walk  without pain.    Currently in Pain?  Other (Comment)   No pain assessment provided        [facility] PT Assessment - 06/22/19 0001      Assessment   Medical Diagnosis  Right hip pain.    Referring Provider (PT)  Layla Barter    Hand Dominance  Left    Next MD Visit  None/PRN      Precautions   Precautions  None      Restrictions   Weight Bearing Restrictions  No                   [facility] Adult PT Treatment/Exercise - 06/22/19 0001      Knee/Hip Exercises: Aerobic   Nustep  L4 x18 min      Modalities   Modalities  Electrical Stimulation;Moist Heat      Moist Heat Therapy   Number Minutes Moist Heat  15 Minutes    Moist Heat Location  Hip      Electrical Stimulation   Electrical Stimulation Location  Right buttock.    Electrical Stimulation Action  Pre-Mod    Electrical Stimulation Parameters  80-150 hz x15 min    Electrical Stimulation Goals  Pain;Tone      Manual Therapy   Manual Therapy  Soft tissue mobilization    Soft tissue mobilization  STW/MFR to R HS attachment, Piriformis to reduce muscle tightness and pain               PT Short Term Goals - 04/07/19 1231      PT SHORT TERM GOAL #1   Title  STG's=LTG's.        PT Long Term Goals - 06/11/19 1530      PT LONG TERM GOAL #1   Title  Ind with an HEP.    Time  6    Period  Weeks    Status  On-going      PT LONG TERM GOAL #2   Title  Sit 30 minutes with pain not > 3-4/10.    Time  6    Period  Weeks    Status  On-going      PT LONG TERM GOAL #3   Title  Walk a community distance with pain not > 2-3/10.    Time  6    Period  Weeks    Status  On-going      PT LONG TERM GOAL #4   Title  Perform ADL's with pain not > 2-3/10.    Time  6    Period  Weeks    Status  On-going            Plan - 06/22/19 1208    Clinical Impression Statement  Patient presented in clinic with continued R buttock pain especially while sitting. Tightness and TPs continue to be palpable in  R proximal HS and Piriformis region. Continued inflammation of the R ischial tuberosity bursa palpable as well. Normal modalites response noted following removal of the modalities.    Personal Factors and Comorbidities  Age;Comorbidity 1;Comorbidity 2    Comorbidities  Fx. of coocyx, DM, COPD, Lumbar fusion, sleep apnea.    Examination-Activity Limitations  Locomotion Level;Sit;Squat    Stability/Clinical Decision Making  Evolving/Moderate complexity    Rehab Potential  Good    PT Frequency  2x / week    PT Duration  6 weeks    PT Treatment/Interventions  ADLs/Self Care Home Management;Cryotherapy;Electrical Stimulation;Moist Heat;Ultrasound;Therapeutic activities;Therapeutic exercise;Manual techniques;Patient/family education;Passive range of motion;Dry needling    PT Next Visit Plan  continue with POC Combo e'stim/U/S to right Piriformis, STW/M, Piriformis stretch on right, SKTC stretch, electrical stimulation, dry needling.    Consulted and Agree with Plan of Care  Patient       Patient will benefit from skilled therapeutic intervention in order to improve the following deficits and impairments:  Abnormal gait, Pain, Decreased activity tolerance, Decreased range of motion  Visit Diagnosis: Pain in right hip  Stiffness of right ankle, not elsewhere classified     Problem List There are no active problems to display for this patient.   Marvell Fuller, PTA 06/22/2019, 12:15 PM  [facility] [address] [place], [place], [address] Phone: [phone]   Fax:  [phone]  Name: Sheena Marquez MRN: [id number] Date of Birth: [birth year]

## 2019-06-24 ENCOUNTER — Other Ambulatory Visit: Payer: Self-pay

## 2019-06-24 ENCOUNTER — Ambulatory Visit: Payer: Medicare Other | Admitting: Physical Therapy

## 2019-06-24 DIAGNOSIS — M25551 Pain in right hip: Secondary | ICD-10-CM | POA: Diagnosis not present

## 2019-06-24 DIAGNOSIS — M25671 Stiffness of right ankle, not elsewhere classified: Secondary | ICD-10-CM

## 2019-06-24 NOTE — Therapy (Signed)
[facility] [place], [place], [address] Phone: [phone]   Fax:  [phone]  Physical Therapy Treatment  Patient Details  Name: Sheena Marquez MRN: [id number] Date of Birth: 04-26-1951 Referring Provider (PT): Layla Barter   Encounter Date: 06/24/2019  PT End of Session - 06/24/19 1351    Visit Number  12    Number of Visits  20    Date for PT Re-Evaluation  07/24/19    Authorization Type  FOTO AT LEAST EVERY 5TH VISIT.  PROGRESS NOTE AT 10TH VISIT.  KX MODIFIER AFTER 15 VISITS.    PT Start Time  1115    PT Stop Time  1205    PT Time Calculation (min)  50 min    Activity Tolerance  Patient tolerated treatment well    Behavior During Therapy  WFL for tasks assessed/performed       Past Medical History:  Diagnosis Date  . Anxiety    panic attacks  . Asthma 5-6 yrs. ago   asthma attack 5-6 yrs. ago [facility] in ICU 2 weeks  . Bulging disc    pt. states she has 3 bulging disc  . COPD (chronic obstructive pulmonary disease) ([place])   . Diabetes mellitus without complication ([place])   . Fracture of coccyx ([place]) 12/2012  . Hypertension   . Shortness of breath   . Sleep apnea     Past Surgical History:  Procedure Laterality Date  . ABDOMINAL HYSTERECTOMY    . MULTIPLE EXTRACTIONS WITH ALVEOLOPLASTY N/A 09/07/2013   Procedure: MULTIPLE EXTRACION WITH ALVEOLOPLASTY;  Surgeon: Gae Bon, DDS;  Location: [place];  Service: Oral Surgery;  Laterality: N/A;  . SKIN CANCER EXCISION     head, upper lip, right hand    There were no vitals filed for this visit.                    [facility] Adult PT Treatment/Exercise - 06/24/19 0001      Exercises   Exercises  Knee/Hip      Knee/Hip Exercises: Aerobic   Nustep  Level 4 x 15 minutes.      Modalities   Modalities  Teacher, English as a foreign language Location  RT buttock    Electrical Stimulation Action   Pre-mod.    Electrical Stimulation Parameters  80-150 Hz x 20 minutes.    Electrical Stimulation Goals  Tone;Pain      Manual Therapy   Soft tissue mobilization  Left sdly position with pillow between knees:  STW/M to right ischial tuberosity region and hamstrins x 9 minutes.               PT Short Term Goals - 04/07/19 1231      PT SHORT TERM GOAL #1   Title  STG's=LTG's.        PT Long Term Goals - 06/11/19 1530      PT LONG TERM GOAL #1   Title  Ind with an HEP.    Time  6    Period  Weeks    Status  On-going      PT LONG TERM GOAL #2   Title  Sit 30 minutes with pain not > 3-4/10.    Time  6    Period  Weeks    Status  On-going      PT LONG TERM GOAL #3   Title  Walk a community distance  with pain not > 2-3/10.    Time  6    Period  Weeks    Status  On-going      PT LONG TERM GOAL #4   Title  Perform ADL's with pain not > 2-3/10.    Time  6    Period  Weeks    Status  On-going            Plan - 06/24/19 1355    Clinical Impression Statement  Patient did well with deep soft tissue work to right ischial tuberosity region.    Personal Factors and Comorbidities  Age;Comorbidity 1;Comorbidity 2    Comorbidities  Fx. of coocyx, DM, COPD, Lumbar fusion, sleep apnea.    Examination-Activity Limitations  Locomotion Level;Sit;Squat    Stability/Clinical Decision Making  Evolving/Moderate complexity    Rehab Potential  Good    PT Frequency  2x / week    PT Duration  6 weeks    PT Treatment/Interventions  ADLs/Self Care Home Management;Cryotherapy;Electrical Stimulation;Moist Heat;Ultrasound;Therapeutic activities;Therapeutic exercise;Manual techniques;Patient/family education;Passive range of motion;Dry needling    PT Next Visit Plan  continue with POC Combo e'stim/U/S to right Piriformis, STW/M, Piriformis stretch on right, SKTC stretch, electrical stimulation, dry needling.    Consulted and Agree with Plan of Care  Patient       Patient will benefit  from skilled therapeutic intervention in order to improve the following deficits and impairments:  Abnormal gait, Pain, Decreased activity tolerance, Decreased range of motion  Visit Diagnosis: Pain in right hip  Stiffness of right ankle, not elsewhere classified     Problem List There are no active problems to display for this patient.   Krysteena Stalker, [place] MPT 06/24/2019, 1:59 PM  [facility] [address] [place], [place], [address] Phone: [phone]   Fax:  [phone]  Name: Sheena Marquez MRN: [id number] Date of Birth: 10/31/1950

## 2019-06-30 ENCOUNTER — Other Ambulatory Visit: Payer: Self-pay

## 2019-06-30 ENCOUNTER — Ambulatory Visit: Payer: Medicare Other | Attending: Internal Medicine | Admitting: Physical Therapy

## 2019-06-30 DIAGNOSIS — M25551 Pain in right hip: Secondary | ICD-10-CM | POA: Insufficient documentation

## 2019-06-30 DIAGNOSIS — M25671 Stiffness of right ankle, not elsewhere classified: Secondary | ICD-10-CM | POA: Insufficient documentation

## 2019-06-30 NOTE — Therapy (Signed)
[facility] [address] [place], [place], [address] Phone: [phone]   Fax:  [phone]  Physical Therapy Treatment  Patient Details  Name: Sheena Marquez MRN: [id number] Date of Birth: Dec 09, 1950 Referring Provider (PT): Legrand Rams   Encounter Date: 06/30/2019  PT End of Session - 06/30/19 1356    Visit Number  13    Number of Visits  20    Authorization Type  FOTO AT LEAST EVERY 5TH VISIT.  PROGRESS NOTE AT 10TH VISIT.  KX MODIFIER AFTER 15 VISITS.    PT Start Time  0100    PT Stop Time  0154    PT Time Calculation (min)  54 min    Activity Tolerance  Patient tolerated treatment well    Behavior During Therapy  WFL for tasks assessed/performed       Past Medical History:  Diagnosis Date  . Anxiety    panic attacks  . Asthma 5-6 yrs. ago   asthma attack 5-6 yrs. ago [facility] in ICU 2 weeks  . Bulging disc    pt. states she has 3 bulging disc  . COPD (chronic obstructive pulmonary disease) (HCC)   . Diabetes mellitus without complication (HCC)   . Fracture of coccyx (HCC) 12/2012  . Hypertension   . Shortness of breath   . Sleep apnea     Past Surgical History:  Procedure Laterality Date  . ABDOMINAL HYSTERECTOMY    . MULTIPLE EXTRACTIONS WITH ALVEOLOPLASTY N/A 09/07/2013   Procedure: MULTIPLE EXTRACION WITH ALVEOLOPLASTY;  Surgeon: Georgia Lopes, DDS;  Location[facility];  Service: Oral Surgery;  Laterality: N/A;  . SKIN CANCER EXCISION     head, upper lip, right hand    There were no vitals filed for this visit.  Subjective Assessment - 06/30/19 1359    Subjective  COVID-19 screen performed prior to patient entering clinic.  I'm okay as long as I don't sit.    Pertinent History  Fx. of coocyx, DM, COPD, Lumbar fusion, sleep apnea.    How long can you sit comfortably?  5 minutes    How long can you walk comfortably?  Couple of blocks.    Patient Stated Goals  Walk without pain.    Currently in Pain?  Yes     Pain Score  7     Pain Location  Buttocks    Pain Orientation  Right    Pain Descriptors / Indicators  Tightness;Discomfort    Pain Type  Chronic pain    Pain Onset  More than a month ago                       [facility] Adult PT Treatment/Exercise - 06/30/19 0001      Exercises   Exercises  Knee/Hip      Knee/Hip Exercises: Aerobic   Nustep  Level 4 x 13 minutes.      Programme researcher, broadcasting/film/video Location  RT buttock.    Electrical Stimulation Action  Pre-mod.    Electrical Stimulation Parameters  80-150 Hz x 20 minutes.    Electrical Stimulation Goals  Tone;Pain      Ultrasound   Ultrasound Location  Right ischial tuberosity.    Ultrasound Parameters  U/S at 1.50 W/CM2 x 10 minutes.               PT Short Term Goals - 04/07/19 1231      PT SHORT TERM GOAL #1  Title  STG's=LTG's.        PT Long Term Goals - 06/11/19 1530      PT LONG TERM GOAL #1   Title  Ind with an HEP.    Time  6    Period  Weeks    Status  On-going      PT LONG TERM GOAL #2   Title  Sit 30 minutes with pain not > 3-4/10.    Time  6    Period  Weeks    Status  On-going      PT LONG TERM GOAL #3   Title  Walk a community distance with pain not > 2-3/10.    Time  6    Period  Weeks    Status  On-going      PT LONG TERM GOAL #4   Title  Perform ADL's with pain not > 2-3/10.    Time  6    Period  Weeks    Status  On-going            Plan - 06/30/19 1459    Clinical Impression Statement  Patient's pain very isolated over right ischial tuberosity.  Her CC continues to be pain when she sits.    Personal Factors and Comorbidities  Age;Comorbidity 1;Comorbidity 2       Patient will benefit from skilled therapeutic intervention in order to improve the following deficits and impairments:     Visit Diagnosis: Pain in right hip  Stiffness of right ankle, not elsewhere classified     Problem List There are no active problems to  display for this patient.   [name], [place] MPT 06/30/2019, 3:08 PM  [facility] [address] [place], [place], [address] Phone: [phone]   Fax:  [phone]  Name: Sheena Marquez MRN: [id number] Date of Birth: Jan 31, 1951

## 2019-07-02 ENCOUNTER — Other Ambulatory Visit: Payer: Self-pay

## 2019-07-02 ENCOUNTER — Ambulatory Visit: Payer: Medicare Other | Admitting: Physical Therapy

## 2019-07-02 DIAGNOSIS — M25551 Pain in right hip: Secondary | ICD-10-CM

## 2019-07-02 DIAGNOSIS — M25671 Stiffness of right ankle, not elsewhere classified: Secondary | ICD-10-CM

## 2019-07-02 NOTE — Therapy (Addendum)
[facility] [place], [place], [address] Phone: [phone]   Fax:  [phone]  Physical Therapy Treatment PHYSICAL THERAPY DISCHARGE SUMMARY  Visits from Start of Care: 14  Current functional level related to goals / functional outcomes: See below   Remaining deficits: See goals   Education / Equipment: HEP Plan:                                                    Patient goals were not met. Patient is being discharged due to not returning since the last visit.  ?????     Patient Details  Name: Sheena Marquez MRN: [id number] Date of Birth: 01-Oct-1950 Referring Provider (PT): Layla Barter   Encounter Date: 07/02/2019  PT End of Session - 07/02/19 1130    Visit Number  14    Number of Visits  20    Date for PT Re-Evaluation  07/24/19    Authorization Type  FOTO AT LEAST EVERY 5TH VISIT.  PROGRESS NOTE AT 10TH VISIT.  KX MODIFIER AFTER 15 VISITS.    PT Start Time  1119    PT Stop Time  1216    PT Time Calculation (min)  57 min    Activity Tolerance  Patient tolerated treatment well    Behavior During Therapy  WFL for tasks assessed/performed       Past Medical History:  Diagnosis Date  . Anxiety    panic attacks  . Asthma 5-6 yrs. ago   asthma attack 5-6 yrs. ago [facility] in ICU 2 weeks  . Bulging disc    pt. states she has 3 bulging disc  . COPD (chronic obstructive pulmonary disease) ([place])   . Diabetes mellitus without complication ([place])   . Fracture of coccyx ([place]) 12/2012  . Hypertension   . Shortness of breath   . Sleep apnea     Past Surgical History:  Procedure Laterality Date  . ABDOMINAL HYSTERECTOMY    . MULTIPLE EXTRACTIONS WITH ALVEOLOPLASTY N/A 09/07/2013   Procedure: MULTIPLE EXTRACION WITH ALVEOLOPLASTY;  Surgeon: Gae Bon, DDS;  Location: [facility];  Service: Oral Surgery;  Laterality: N/A;  . SKIN CANCER EXCISION     head, upper lip, right hand    There were no vitals  filed for this visit.  Subjective Assessment - 07/02/19 1131    Subjective  COVID 19 screening performed on patient upon arrival. Patient reports no reduction of pain since DN.    Pertinent History  Fx. of coocyx, DM, COPD, Lumbar fusion, sleep apnea.    How long can you sit comfortably?  5 minutes    How long can you walk comfortably?  Couple of blocks.    Patient Stated Goals  Walk without pain.    Currently in Pain?  Yes    Pain Location  Buttocks    Pain Orientation  Right    Pain Descriptors / Indicators  Discomfort    Pain Type  Chronic pain    Pain Onset  More than a month ago    Pain Frequency  Constant         [facility] PT Assessment - 07/02/19 0001      Assessment   Medical Diagnosis  Right hip pain.    Referring Provider (PT)  Layla Barter    Hand Dominance  Left    Next MD Visit  None/PRN      Precautions   Precautions  None      Restrictions   Weight Bearing Restrictions  No                   [facility] Adult PT Treatment/Exercise - 07/02/19 0001      Knee/Hip Exercises: Aerobic   Nustep  L4 x15 min      Modalities   Modalities  Electrical Stimulation;Moist Heat      Moist Heat Therapy   Number Minutes Moist Heat  15 Minutes    Moist Heat Location  Hip      Electrical Stimulation   Electrical Stimulation Location  R inferior buttock    Electrical Stimulation Action  Pre-Mod    Electrical Stimulation Parameters  80-150 hz x15 min    Electrical Stimulation Goals  Tone;Pain      Manual Therapy   Manual Therapy  Myofascial release    Myofascial Release  IASTW to R ischial tuberosity region to reduce inflammation and pain               PT Short Term Goals - 04/07/19 1231      PT SHORT TERM GOAL #1   Title  STG's=LTG's.        PT Long Term Goals - 06/11/19 1530      PT LONG TERM GOAL #1   Title  Ind with an HEP.    Time  6    Period  Weeks    Status  On-going      PT LONG TERM GOAL #2   Title  Sit 30 minutes with pain  not > 3-4/10.    Time  6    Period  Weeks    Status  On-going      PT LONG TERM GOAL #3   Title  Walk a community distance with pain not > 2-3/10.    Time  6    Period  Weeks    Status  On-going      PT LONG TERM GOAL #4   Title  Perform ADL's with pain not > 2-3/10.    Time  6    Period  Weeks    Status  On-going            Plan - 07/02/19 1224    Clinical Impression Statement  Patient presented in clinic with continued pain surrounding the R ischial tuberosity. Patient still very tender over R ischial bursa. Normal redness response from IASTW over the R ischial tuberosity. Normal modalities response noted following removal of the modalities.    Personal Factors and Comorbidities  Age;Comorbidity 1;Comorbidity 2    Comorbidities  Fx. of coocyx, DM, COPD, Lumbar fusion, sleep apnea.    Examination-Activity Limitations  Locomotion Level;Sit;Squat    Stability/Clinical Decision Making  Evolving/Moderate complexity    Rehab Potential  Good    PT Frequency  2x / week    PT Duration  6 weeks    PT Treatment/Interventions  ADLs/Self Care Home Management;Cryotherapy;Electrical Stimulation;Moist Heat;Ultrasound;Therapeutic activities;Therapeutic exercise;Manual techniques;Patient/family education;Passive range of motion;Dry needling    PT Next Visit Plan  continue with POC Combo e'stim/U/S to right Piriformis, STW/M, Piriformis stretch on right, SKTC stretch, electrical stimulation, dry needling.    Consulted and Agree with Plan of Care  Patient       Patient will benefit from skilled therapeutic intervention in order to improve the following deficits and impairments:  Abnormal gait, Pain, Decreased activity tolerance, Decreased range of motion  Visit Diagnosis: Pain in right hip  Stiffness of right ankle, not elsewhere classified     Problem List There are no active problems to display for this patient.   Standley Brooking, PTA 07/02/2019, 12:27 PM  [facility] [address] [place], [place], [address] Phone: [phone]   Fax:  [phone]  Name: Sheena Marquez MRN: [id number] Date of Birth: 07/31/1951

## 2021-12-25 ENCOUNTER — Ambulatory Visit: Payer: Medicare Other | Attending: Internal Medicine

## 2021-12-25 DIAGNOSIS — Z9181 History of falling: Secondary | ICD-10-CM | POA: Diagnosis not present

## 2021-12-25 DIAGNOSIS — M6281 Muscle weakness (generalized): Secondary | ICD-10-CM | POA: Insufficient documentation

## 2021-12-26 NOTE — Therapy (Signed)
[place] [facility] ?[address] ?[place], [place], [address] ?Phone: [phone]   Fax:  [phone] ? ?Physical Therapy Evaluation ? ?Patient Details  ?Name: Sheena Marquez ?MRN: [id number] ?Date of Birth: [birth year] ?Referring Provider (PT): Burcher, MD ? ? ?Encounter Date: 12/25/2021 ? ? PT End of Session - 12/25/21 1031   ? ? Visit Number 1   ? Number of Visits 10   ? Date for PT Re-Evaluation 03/23/22   ? PT Start Time 1032   ? PT Stop Time 1116   ? PT Time Calculation (min) 44 min   ? Activity Tolerance Patient tolerated treatment well   ? Behavior During Therapy [facility] for tasks assessed/performed   ? ?  ?  ? ?  ? ? ?Past Medical History:  ?Diagnosis Date  ? Anxiety   ? panic attacks  ? Asthma 5-6 yrs. ago  ? asthma attack 5-6 yrs. ago [facility] in ICU 2 weeks  ? Bulging disc   ? pt. states she has 3 bulging disc  ? COPD (chronic obstructive pulmonary disease) ([facility])   ? Diabetes mellitus without complication ([facility])   ? Fracture of coccyx ([facility]) 12/2012  ? Hypertension   ? Shortness of breath   ? Sleep apnea   ? ? ?Past Surgical History:  ?Procedure Laterality Date  ? ABDOMINAL HYSTERECTOMY    ? MULTIPLE EXTRACTIONS WITH ALVEOLOPLASTY N/A 09/07/2013  ? Procedure: MULTIPLE EXTRACION WITH ALVEOLOPLASTY;  Surgeon: Georgia Lopes, DDS;  [facility];  Service: Oral Surgery;  Laterality: N/A;  ? SKIN CANCER EXCISION    ? head, upper lip, right hand  ? ? ?There were no vitals filed for this visit. ? ? ? Subjective Assessment - 12/25/21 1031   ? ? Subjective Patient reports that she feel about 6 weeks ago while standing on a tall step ladder. She then tried to turn around, but then got dizzy causing her to fall on her back. She notes that her pain was primarily located along her sacrum. She notes that her pain has been steadily getting better, but it still hurts. She has been sitting on a dounut at home to ease her pain with sitting. She had fallen multiple other times previously when  she tripped on a shoe and a stick.   ? Pertinent History hisotry of vertigo, lumbar surgeries   ? Limitations Sitting   ? How long can you sit comfortably? pain with upright sitting   ? Patient Stated Goals reduced fall risk, improved strength   ? Currently in Pain? Yes   ? Pain Score 8    ? Pain Location Sacrum   ? Pain Descriptors / Indicators Sharp;Sore   ? Pain Type Acute pain   ? Pain Radiating Towards pain radiates into her right hip into her knee (rarely)   ? Pain Onset More than a month ago   ? Pain Frequency Intermittent   ? Aggravating Factors  sitting,   ? Pain Relieving Factors shifting her weight onto her right hip   ? Effect of Pain on Daily Activities she is able to do everything, but she has to go slower and pace herrself   ? ?  ?  ? ?  ? ? ? ? ? [facility] PT Assessment - 12/26/21 0001   ? ?  ? Assessment  ? Medical Diagnosis Lower extremity weakness   ? Referring Provider (PT) Tommie Raymond, MD   ? Onset Date/Surgical Date --   6 weeks ago  ?  Next MD Visit unknown   ? Prior Therapy No, not for balance or weakness   ?  ? Precautions  ? Precautions Fall   ?  ? Restrictions  ? Weight Bearing Restrictions No   ?  ? Balance Screen  ? Has the patient fallen in the past 6 months Yes   ? How many times? 2   ? Has the patient had a decrease in activity level because of a fear of falling?  No   ? Is the patient reluctant to leave their home because of a fear of falling?  No   ?  ? Home Environment  ? Living Environment Private residence   ? Living Arrangements Alone   ? Home Access Ramped entrance;Stairs to enter   ? Entrance Stairs-Number of Steps 2-8   step to pattern  ? Entrance Stairs-Rails Can reach both   ? Home Layout One level   ?  ? Prior Function  ? Level of Independence Independent   ? Leisure yard work,   ?  ? Cognition  ? Overall Cognitive Status Within Functional Limits for tasks assessed   ? Attention Focused   ? Focused Attention Appears intact   ? Memory Appears intact   ? Awareness Appears intact   ?  Problem Solving Appears intact   ?  ? Sensation  ? Additional Comments Rare numbness in right leg to the knee, but none currently   ?  ? Strength  ? Right Hip Flexion 4-/5   ? Left Hip Flexion 4-/5   ? Right Knee Flexion 4+/5   ? Right Knee Extension 4+/5   ? Left Knee Flexion 4+/5   ? Left Knee Extension 4/5   ? Right Ankle Dorsiflexion 4-/5   ? Left Ankle Dorsiflexion 4-/5   ?  ? Transfers  ? Transfers Sit to Stand;Stand to Sit   ? Sit to Stand 7: Independent;Without upper extremity assist   ? Five time sit to stand comments  1st attempt: 18 seconds 2nd attemp: 11 seconds   "light headed" after the second attempt  ? Stand to Sit 7: Independent;Without upper extremity assist   ?  ? Ambulation/Gait  ? Ambulation/Gait Yes   ? Ambulation/Gait Assistance 6: Modified independent (Device/Increase time)   ? Assistive device None   ? Gait Pattern Step-through pattern;Decreased stride length;Left foot flat;Right foot flat;Trunk flexed   ? Gait velocity decreased   ?  ? Balance  ? Balance Assessed Yes   ?  ? Static Standing Balance  ? Static Standing Balance -  Activities  Romberg - Eyes Opened;Tandam Stance - Right Leg;Tandam Stance - Left Leg   ? Static Standing - Comment/# of Minutes Tandem: 30 seconds each; Rhomberg: 30 seconds each   ? ?  ?  ? ?  ? ? ? ? ? ? ? ? ? ? ? ? ? ?Objective measurements completed on examination: See above findings.  ? ? ? ? ? ? ? ? ? ? ? ? ? ? ? ? ? ? ? PT Long Term Goals - 12/26/21 0807   ? ?  ? PT LONG TERM GOAL #1  ? Title Patient will be independent with her HEP.   ? Time 5   ? Period Weeks   ? Status New   ? Target Date 01/30/22   ?  ? PT LONG TERM GOAL #2  ? Title Patient will be able to safely navigate at least 4 steps with a reciprocal pattern for  improved household mobility.   ? Time 5   ? Period Weeks   ? Status New   ? Target Date 01/30/22   ?  ? PT LONG TERM GOAL #3  ? Title Patient will improve her hip flexor strength to 4/5 bilaterally for improved functional mobility.   ? Time 5    ? Period Weeks   ? Status New   ? Target Date 01/30/22   ? ?  ?  ? ?  ? ? ? ? ? ? ? ? ? Plan - 12/26/21 0754   ? ? Clinical Impression Statement Patient is a 71 year old female presenting to physical therapy with lower extremity weakness with a history of multiple falls. She is at an elevated risk of falling due to her history of multiple falling and lower extremity weakness. However, she was able to demonstrate 30 seconds of static balance in rhomberg and tandem and a five time sit to stand time of less than 12 seconds which placed her at a low risk of falling. She would benefit from a BPPV assessment due to her reported history of vertigo and her reported dizziness with activities such as bathing and self care. Recommend that she continue with skilled physical therapy to address her remaining impairments to maximize her safety and return to her prior level of function.   ? Personal Factors and Comorbidities Other;Time since onset of injury/illness/exacerbation   ? Examination-Activity Limitations Locomotion Level;Transfers;Sit;Stairs;Bathing   ? Examination-Participation Restrictions Cleaning;Yard Work;Church   ? Stability/Clinical Decision Making Evolving/Moderate complexity   ? Clinical Decision Making Moderate   ? Rehab Potential Good   ? PT Frequency 2x / week   ? PT Duration Other (comment)   5 weeks  ? PT Treatment/Interventions ADLs/Self Care Home Management;Cryotherapy;Electrical Stimulation;Moist Heat;Neuromuscular re-education;Balance training;Therapeutic exercise;Therapeutic activities;Functional mobility training;Stair training;Gait training;Patient/family education;Manual techniques;Vestibular   ? PT Next Visit Plan nustep, lower extremity strengthening and balance interventions   ? Consulted and Agree with Plan of Care Patient   ? ?  ?  ? ?  ? ? ?Patient will benefit from skilled therapeutic intervention in order to improve the following deficits and impairments:  Abnormal gait, Difficulty walking,  Dizziness, Decreased activity tolerance, Pain, Decreased balance, Impaired sensation, Decreased strength, Decreased mobility ? ?Visit Diagnosis: ?Muscle weakness (generalized) ? ?History of falling ?

## 2021-12-28 ENCOUNTER — Ambulatory Visit: Payer: Medicare Other

## 2021-12-28 DIAGNOSIS — Z9181 History of falling: Secondary | ICD-10-CM

## 2021-12-28 DIAGNOSIS — M6281 Muscle weakness (generalized): Secondary | ICD-10-CM

## 2021-12-28 NOTE — Therapy (Signed)
[place] ?[facility] ?[place] ?[place], [place], [address] ?Phone: [phone]   Fax:  [phone] ? ?Physical Therapy Treatment ? ?Patient Details  ?Name: Sheena Marquez ?MRN: [id number] ?Date of Birth: June 26, 1951 ?Referring Provider (PT): Burcher, MD ? ? ?Encounter Date: 12/28/2021 ? ? PT End of Session - 12/28/21 0950   ? ? Visit Number 2   ? Number of Visits 10   ? Date for PT Re-Evaluation 03/23/22   ? PT Start Time [phone]   ? PT Stop Time 1030   ? PT Time Calculation (min) 42 min   ? Activity Tolerance Patient tolerated treatment well   ? Behavior During Therapy [facility] for tasks assessed/performed   ? ?  ?  ? ?  ? ? ?Past Medical History:  ?Diagnosis Date  ? Anxiety   ? panic attacks  ? Asthma 5-6 yrs. ago  ? asthma attack 5-6 yrs. ago [facility] in ICU 2 weeks  ? Bulging disc   ? pt. states she has 3 bulging disc  ? COPD (chronic obstructive pulmonary disease) ([place])   ? Diabetes mellitus without complication ([place])   ? Fracture of coccyx ([place]) 12/2012  ? Hypertension   ? Shortness of breath   ? Sleep apnea   ? ? ?Past Surgical History:  ?Procedure Laterality Date  ? ABDOMINAL HYSTERECTOMY    ? MULTIPLE EXTRACTIONS WITH ALVEOLOPLASTY N/A 09/07/2013  ? Procedure: MULTIPLE EXTRACION WITH ALVEOLOPLASTY;  Surgeon: Gae Bon, DDS;  Location: [facility];  Service: Oral Surgery;  Laterality: N/A;  ? SKIN CANCER EXCISION    ? head, upper lip, right hand  ? ? ?There were no vitals filed for this visit. ? ? Subjective Assessment - 12/28/21 0949   ? ? Subjective Patient reports that she feels alright today.   ? Pertinent History hisotry of vertigo, lumbar surgeries   ? Limitations Sitting   ? How long can you sit comfortably? pain with upright sitting   ? Patient Stated Goals reduced fall risk, improved strength   ? Currently in Pain? No/denies   ? Pain Onset More than a month ago   ? ?  ?  ? ?  ? ? ? ? ? ? ? ? ? ? ? ? ? ? ? ? ? ? ? ? [place] [facility] Adult PT Treatment/Exercise - 12/28/21 0001   ? ?  ?  Exercises  ? Exercises Knee/Hip   ?  ? Knee/Hip Exercises: Aerobic  ? Nustep L4 x 16 minutes   ?  ? Knee/Hip Exercises: Standing  ? Knee Flexion Both   3 minutes  ? Knee Flexion Limitations 4 pound ankle weights   ? Forward Step Up Both;Hand Hold: 2;Step Height: 6"   3 minutes  ? Rocker Board 4 minutes   ?  ? Knee/Hip Exercises: Seated  ? Long [employer] Both;Weights   3 minutes  ? Long Arc Quad Weight 4 lbs.   ? Sit to Sand 20 reps;without UE support   with chair tap  ? ?  ?  ? ?  ? ? ? ? ? ? ? ? ? ? ? ? ? ? ? PT Long Term Goals - 12/26/21 0807   ? ?  ? PT LONG TERM GOAL #1  ? Title Patient will be independent with her HEP.   ? Time 5   ? Period Weeks   ? Status New   ? Target Date 01/30/22   ?  ? PT LONG TERM GOAL #2  ?  Title Patient will be able to safely navigate at least 4 steps with a reciprocal pattern for improved household mobility.   ? Time 5   ? Period Weeks   ? Status New   ? Target Date 01/30/22   ?  ? PT LONG TERM GOAL #3  ? Title Patient will improve her hip flexor strength to 4/5 bilaterally for improved functional mobility.   ? Time 5   ? Period Weeks   ? Status New   ? Target Date 01/30/22   ? ?  ?  ? ?  ? ? ? ? ? ? ? ? Plan - 12/28/21 0951   ? ? Clinical Impression Statement Patient was introduced to multiple new interventions for improved lower extremity strength. She required minimal cuing with today's new interventions for proper pacing and biomechanics to facilitate increased muscular demand. She experienced no pain or discomfort with any of today's interventions.She reported feeling tired upon the conclusion of treatment. She continues to require skilled physical therapy to address her remaining impairments to return to her prior level of function.   ? Personal Factors and Comorbidities Other;Time since onset of injury/illness/exacerbation   ? Examination-Activity Limitations Locomotion Level;Transfers;Sit;Stairs;Bathing   ? Examination-Participation Restrictions Cleaning;Yard Work;Church   ?  Stability/Clinical Decision Making Evolving/Moderate complexity   ? Rehab Potential Good   ? PT Frequency 2x / week   ? PT Duration Other (comment)   5 weeks  ? PT Treatment/Interventions ADLs/Self Care Home Management;Cryotherapy;Electrical Stimulation;Moist Heat;Neuromuscular re-education;Balance training;Therapeutic exercise;Therapeutic activities;Functional mobility training;Stair training;Gait training;Patient/family education;Manual techniques;Vestibular   ? PT Next Visit Plan nustep, lower extremity strengthening and balance interventions   ? Consulted and Agree with Plan of Care Patient   ? ?  ?  ? ?  ? ? ?Patient will benefit from skilled therapeutic intervention in order to improve the following deficits and impairments:  Abnormal gait, Difficulty walking, Dizziness, Decreased activity tolerance, Pain, Decreased balance, Impaired sensation, Decreased strength, Decreased mobility ? ?Visit Diagnosis: ?Muscle weakness (generalized) ? ?History of falling ? ? ? ? ?Problem List ?There are no problems to display for this patient. ? ? ?Darlin Coco, PT ?12/28/2021, 1:09 PM ? ?[place] ?[facility] ?[place] ?[place], [place], [address] ?Phone: [phone]   Fax:  [phone] ? ?Name: Sheena Marquez ?MRN: [id number] ?Date of Birth: [birth year] ? ? ? ?

## 2022-01-01 ENCOUNTER — Ambulatory Visit: Payer: Medicare Other | Admitting: *Deleted

## 2022-01-01 DIAGNOSIS — M6281 Muscle weakness (generalized): Secondary | ICD-10-CM

## 2022-01-01 DIAGNOSIS — Z9181 History of falling: Secondary | ICD-10-CM

## 2022-01-01 NOTE — Therapy (Signed)
?[facility] ?[address] ?[place], [place], [address] ?Phone: [phone]   Fax:  [phone] ? ?Physical Therapy Treatment ? ?Patient Details  ?Name: Sheena Marquez ?MRN: [id number] ?Date of Birth: Jul 11, 1951 ?Referring Provider (PT): Burcher, MD ? ? ?Encounter Date: 01/01/2022 ? ? PT End of Session - 01/01/22 1036   ? ? Visit Number 3   ? Number of Visits 10   ? Date for PT Re-Evaluation 03/23/22   ? PT Start Time 1030   ? PT Stop Time 1119   ? PT Time Calculation (min) 49 min   ? ?  ?  ? ?  ? ? ?Past Medical History:  ?Diagnosis Date  ? Anxiety   ? panic attacks  ? Asthma 5-6 yrs. ago  ? asthma attack 5-6 yrs. ago [facility] in ICU 2 weeks  ? Bulging disc   ? pt. states she has 3 bulging disc  ? COPD (chronic obstructive pulmonary disease) (HCC)   ? Diabetes mellitus without complication (HCC)   ? Fracture of coccyx (HCC) 12/2012  ? Hypertension   ? Shortness of breath   ? Sleep apnea   ? ? ?Past Surgical History:  ?Procedure Laterality Date  ? ABDOMINAL HYSTERECTOMY    ? MULTIPLE EXTRACTIONS WITH ALVEOLOPLASTY N/A 09/07/2013  ? Procedure: MULTIPLE EXTRACION WITH ALVEOLOPLASTY;  Surgeon: Georgia Lopes, DDS;  Location: [facility] OR;  Service: Oral Surgery;  Laterality: N/A;  ? SKIN CANCER EXCISION    ? head, upper lip, right hand  ? ? ?There were no vitals filed for this visit. ? ? Subjective Assessment - 01/01/22 1040   ? ? Subjective Pt arrived today doing fairly well and reports doing okay after last Rx   ? Pertinent History hisotry of vertigo, lumbar surgeries   ? How long can you sit comfortably? pain with upright sitting   ? Patient Stated Goals reduced fall risk, improved strength   ? Currently in Pain? No/denies   ? ?  ?  ? ?  ? ? ? ? ? ? ? ? ? ? ? ? ? ? ? ? ? ? ? ? [facility] Adult PT Treatment/Exercise - 01/01/22 0001   ? ?  ? Exercises  ? Exercises Knee/Hip   ?  ? Knee/Hip Exercises: Aerobic  ? Nustep L4 x 16 minutes   ?  ? Knee/Hip Exercises: Standing  ? Heel Raises Both;2  sets;15 reps   ? Heel Raises Limitations Toe ups 2x15   ? Hip Flexion Both;3 sets;10 reps   ? Hip Abduction Stengthening;Both;3 sets;15 reps   ? Forward Step Up Both;Hand Hold: 2;Step Height: 6";2 sets;10 reps   3 minutes  ? Rocker Board 4 minutes   ? ?  ?  ? ?  ? ? ? ? ? ? Balance Exercises - 01/01/22 0001   ? ?  ? Balance Exercises: Standing  ? Balance Beam x 5 mins heel/toe forw/retro with 1 hand assist/ CGA . side stepping x 3 mins no assist   ? ?  ?  ? ?  ? ? ? ? ? ? ? ? ? ? PT Long Term Goals - 12/26/21 0807   ? ?  ? PT LONG TERM GOAL #1  ? Title Patient will be independent with her HEP.   ? Time 5   ? Period Weeks   ? Status New   ? Target Date 01/30/22   ?  ? PT LONG TERM GOAL #2  ? Title Patient will  be able to safely navigate at least 4 steps with a reciprocal pattern for improved household mobility.   ? Time 5   ? Period Weeks   ? Status New   ? Target Date 01/30/22   ?  ? PT LONG TERM GOAL #3  ? Title Patient will improve her hip flexor strength to 4/5 bilaterally for improved functional mobility.   ? Time 5   ? Period Weeks   ? Status New   ? Target Date 01/30/22   ? ?  ?  ? ?  ? ? ? ? ? ? ? ? Plan - 01/01/22 1129   ? ? Clinical Impression Statement Pt arrived today doing fairlywell. Rx focused on LE strengthening as well as balance and prprioception Act's.CGA needed with heel-toe walking on balance beam as well as side stepping. Mainly fatigue end of session.   ? Personal Factors and Comorbidities Other;Time since onset of injury/illness/exacerbation   ? Examination-Activity Limitations Locomotion Level;Transfers;Sit;Stairs;Bathing   ? Rehab Potential Good   ? PT Frequency 2x / week   ? PT Duration Other (comment)   ? PT Treatment/Interventions ADLs/Self Care Home Management;Cryotherapy;Electrical Stimulation;Moist Heat;Neuromuscular re-education;Balance training;Therapeutic exercise;Therapeutic activities;Functional mobility training;Stair training;Gait training;Patient/family education;Manual  techniques;Vestibular   ? PT Next Visit Plan nustep, lower extremity strengthening and balance interventions   ? ?  ?  ? ?  ? ? ?Patient will benefit from skilled therapeutic intervention in order to improve the following deficits and impairments:  Abnormal gait, Difficulty walking, Dizziness, Decreased activity tolerance, Pain, Decreased balance, Impaired sensation, Decreased strength, Decreased mobility ? ?Visit Diagnosis: ?History of falling ? ?Muscle weakness (generalized) ? ? ? ? ?Problem List ?There are no problems to display for this patient. ? ? ?[name],CHRIS, PTA ?01/01/2022, 4:14 PM ? ? ?[facility] ?[address] ?[place], [place], [address] ?Phone: [phone]   Fax:  [phone] ? ?Name: Sheena Marquez ?MRN: [id number] ?Date of Birth: 01-19-1951 ? ? ? ?

## 2022-01-04 ENCOUNTER — Ambulatory Visit: Payer: Medicare Other

## 2022-01-04 DIAGNOSIS — M6281 Muscle weakness (generalized): Secondary | ICD-10-CM

## 2022-01-04 DIAGNOSIS — Z9181 History of falling: Secondary | ICD-10-CM

## 2022-01-04 NOTE — Therapy (Signed)
[place] ?[facility] ?[address] ?[place], [place], [address] ?Phone: [phone]   Fax:  [phone] ? ?Physical Therapy Treatment ? ?Patient Details  ?Name: Sheena Marquez ?MRN: [id number] ?Date of Birth: Aug 05, 1951 ?Referring Provider (PT): Burcher, MD ? ? ?Encounter Date: 01/04/2022 ? ? PT End of Session - 01/04/22 1129   ? ? Visit Number 4   ? Number of Visits 10   ? Date for PT Re-Evaluation 03/23/22   ? PT Start Time 1118   ? PT Stop Time 1158   ? PT Time Calculation (min) 40 min   ? Activity Tolerance Patient tolerated treatment well   ? Behavior During Therapy [facility] for tasks assessed/performed   ? ?  ?  ? ?  ? ? ?Past Medical History:  ?Diagnosis Date  ? Anxiety   ? panic attacks  ? Asthma 5-6 yrs. ago  ? asthma attack 5-6 yrs. ago [facility] in ICU 2 weeks  ? Bulging disc   ? pt. states she has 3 bulging disc  ? COPD (chronic obstructive pulmonary disease) (HCC)   ? Diabetes mellitus without complication (HCC)   ? Fracture of coccyx (HCC) 12/2012  ? Hypertension   ? Shortness of breath   ? Sleep apnea   ? ? ?Past Surgical History:  ?Procedure Laterality Date  ? ABDOMINAL HYSTERECTOMY    ? MULTIPLE EXTRACTIONS WITH ALVEOLOPLASTY N/A 09/07/2013  ? Procedure: MULTIPLE EXTRACION WITH ALVEOLOPLASTY;  Surgeon: Georgia Lopes, DDS;  Location: [facility] OR;  Service: Oral Surgery;  Laterality: N/A;  ? SKIN CANCER EXCISION    ? head, upper lip, right hand  ? ? ?There were no vitals filed for this visit. ? ? Subjective Assessment - 01/04/22 1128   ? ? Subjective Patient reports that she feels alright today.   ? Pertinent History hisotry of vertigo, lumbar surgeries   ? How long can you sit comfortably? pain with upright sitting   ? Patient Stated Goals reduced fall risk, improved strength   ? Currently in Pain? No/denies   ? ?  ?  ? ?  ? ? ? ? ? ? ? ? ? ? ? ? ? ? ? ? ? ? ? ? [facility] Adult PT Treatment/Exercise - 01/04/22 0001   ? ?  ? Knee/Hip Exercises: Aerobic  ? Nustep L4 x 15 minutes   ?  ?  Knee/Hip Exercises: Machines for Strengthening  ? Cybex Knee Extension 10# x 30 reps   ? Cybex Knee Flexion 40# x 30 reps   ?  ? Knee/Hip Exercises: Standing  ? Lateral Step Up Both;Hand Hold: 2;Step Height: 6"   3 minutes  ? Rocker Board 5 minutes   ?  ? Knee/Hip Exercises: Seated  ? Sit to Sand 20 reps;without UE support   ? ?  ?  ? ?  ? ? ? ? ? ? ? ? ? ? ? ? ? ? ? PT Long Term Goals - 12/26/21 0807   ? ?  ? PT LONG TERM GOAL #1  ? Title Patient will be independent with her HEP.   ? Time 5   ? Period Weeks   ? Status New   ? Target Date 01/30/22   ?  ? PT LONG TERM GOAL #2  ? Title Patient will be able to safely navigate at least 4 steps with a reciprocal pattern for improved household mobility.   ? Time 5   ? Period Weeks   ? Status New   ?  Target Date 01/30/22   ?  ? PT LONG TERM GOAL #3  ? Title Patient will improve her hip flexor strength to 4/5 bilaterally for improved functional mobility.   ? Time 5   ? Period Weeks   ? Status New   ? Target Date 01/30/22   ? ?  ?  ? ?  ? ? ? ? ? ? ? ? Plan - 01/04/22 1129   ? ? Clinical Impression Statement Patient was introduced to multiple new interventions for improved lower extremity strength with moderate difficulty. She required minimal cuing with today's interventions for improved eccentric control to facilitate increased muscular demand. She reported feeling tired upon the conclusion of treatment. She continues to require skilled physical therapy to address her remaining impairments to return to her prior level of function.   ? Personal Factors and Comorbidities Other;Time since onset of injury/illness/exacerbation   ? Examination-Activity Limitations Locomotion Level;Transfers;Sit;Stairs;Bathing   ? Rehab Potential Good   ? PT Frequency 2x / week   ? PT Duration Other (comment)   ? PT Treatment/Interventions ADLs/Self Care Home Management;Cryotherapy;Electrical Stimulation;Moist Heat;Neuromuscular re-education;Balance training;Therapeutic exercise;Therapeutic  activities;Functional mobility training;Stair training;Gait training;Patient/family education;Manual techniques;Vestibular   ? PT Next Visit Plan nustep, lower extremity strengthening and balance interventions   ? Consulted and Agree with Plan of Care Patient   ? ?  ?  ? ?  ? ? ?Patient will benefit from skilled therapeutic intervention in order to improve the following deficits and impairments:  Abnormal gait, Difficulty walking, Dizziness, Decreased activity tolerance, Pain, Decreased balance, Impaired sensation, Decreased strength, Decreased mobility ? ?Visit Diagnosis: ?History of falling ? ?Muscle weakness (generalized) ? ? ? ? ?Problem List ?There are no problems to display for this patient. ? ? ?Granville Lewis, PT ?01/04/2022, 12:53 PM ? ?[place] ?[facility] ?[address] ?[place], [place], [address] ?Phone: [phone]   Fax:  [phone] ? ?Name: SHIMIKA AMES ?MRN: [id number] ?Date of Birth: June 08, 1951 ? ? ? ?

## 2022-01-08 ENCOUNTER — Ambulatory Visit: Payer: Medicare Other

## 2022-01-08 DIAGNOSIS — M6281 Muscle weakness (generalized): Secondary | ICD-10-CM | POA: Diagnosis not present

## 2022-01-08 DIAGNOSIS — Z9181 History of falling: Secondary | ICD-10-CM

## 2022-01-08 NOTE — Therapy (Signed)
[place] ?[facility] ?[address] ?[place], [place], [address] ?Phone: [phone]   Fax:  [phone] ? ?Physical Therapy Treatment ? ?Patient Details  ?Name: Sheena Marquez ?MRN: [id number] ?Date of Birth: 15-Apr-1951 ?Referring Provider (PT): Burcher, MD ? ? ?Encounter Date: 01/08/2022 ? ? PT End of Session - 01/08/22 1028   ? ? Visit Number 5   ? Number of Visits 10   ? Date for PT Re-Evaluation 03/23/22   ? PT Start Time 1030   ? PT Stop Time 1115   ? PT Time Calculation (min) 45 min   ? Activity Tolerance Patient tolerated treatment well   ? Behavior During Therapy [facility] for tasks assessed/performed   ? ?  ?  ? ?  ? ? ?Past Medical History:  ?Diagnosis Date  ? Anxiety   ? panic attacks  ? Asthma 5-6 yrs. ago  ? asthma attack 5-6 yrs. ago [facility] in ICU 2 weeks  ? Bulging disc   ? pt. states she has 3 bulging disc  ? COPD (chronic obstructive pulmonary disease) ([place])   ? Diabetes mellitus without complication ([place])   ? Fracture of coccyx ([place]) 12/2012  ? Hypertension   ? Shortness of breath   ? Sleep apnea   ? ? ?Past Surgical History:  ?Procedure Laterality Date  ? ABDOMINAL HYSTERECTOMY    ? MULTIPLE EXTRACTIONS WITH ALVEOLOPLASTY N/A 09/07/2013  ? Procedure: MULTIPLE EXTRACION WITH ALVEOLOPLASTY;  Surgeon: Gae Bon, DDS;  Location: [employer];  Service: Oral Surgery;  Laterality: N/A;  ? SKIN CANCER EXCISION    ? head, upper lip, right hand  ? ? ?There were no vitals filed for this visit. ? ? Subjective Assessment - 01/08/22 1028   ? ? Pertinent History hisotry of vertigo, lumbar surgeries   ? How long can you sit comfortably? pain with upright sitting   ? Patient Stated Goals reduced fall risk, improved strength   ? ?  ?  ? ?  ? ? ? ? ? ? ? ? ? ? ? ? ? ? ? ? ? ? ? ? [place] Adult PT Treatment/Exercise - 01/08/22 0001   ? ?  ? Knee/Hip Exercises: Aerobic  ? Nustep Lvl 5 x 15 mins   ?  ? Knee/Hip Exercises: Machines for Strengthening  ? Cybex Knee Extension 30# x 30 reps   ?  Cybex Knee Flexion 50# x 30 reps   ? Cybex Leg Press 2 plates x 30 reps   ? ?  ?  ? ?  ? ? ? ? ? ? Balance Exercises - 01/08/22 0001   ? ?  ? Balance Exercises: Standing  ? Rockerboard Anterior/posterior   4 mins  ? Marching Solid surface   4#, minimal UE support x 3 mins  ? ?  ?  ? ?  ? ? ? ? ? ? ? ? ? ? PT Long Term Goals - 12/26/21 0807   ? ?  ? PT LONG TERM GOAL #1  ? Title Patient will be independent with her HEP.   ? Time 5   ? Period Weeks   ? Status New   ? Target Date 01/30/22   ?  ? PT LONG TERM GOAL #2  ? Title Patient will be able to safely navigate at least 4 steps with a reciprocal pattern for improved household mobility.   ? Time 5   ? Period Weeks   ? Status New   ? Target Date 01/30/22   ?  ?  PT LONG TERM GOAL #3  ? Title Patient will improve her hip flexor strength to 4/5 bilaterally for improved functional mobility.   ? Time 5   ? Period Weeks   ? Status New   ? Target Date 01/30/22   ? ?  ?  ? ?  ? ? ? ? ? ? ? ? Plan - 01/08/22 1029   ? ? Personal Factors and Comorbidities Other;Time since onset of injury/illness/exacerbation   ? Examination-Activity Limitations Locomotion Level;Transfers;Sit;Stairs;Bathing   ? Rehab Potential Good   ? PT Frequency 2x / week   ? PT Duration Other (comment)   ? PT Treatment/Interventions ADLs/Self Care Home Management;Cryotherapy;Electrical Stimulation;Moist Heat;Neuromuscular re-education;Balance training;Therapeutic exercise;Therapeutic activities;Functional mobility training;Stair training;Gait training;Patient/family education;Manual techniques;Vestibular   ? PT Next Visit Plan nustep, lower extremity strengthening and balance interventions   ? Consulted and Agree with Plan of Care Patient   ? ?  ?  ? ?  ? ? ?Patient will benefit from skilled therapeutic intervention in order to improve the following deficits and impairments:  Abnormal gait, Difficulty walking, Dizziness, Decreased activity tolerance, Pain, Decreased balance, Impaired sensation, Decreased  strength, Decreased mobility ? ?Visit Diagnosis: ?History of falling ? ?Muscle weakness (generalized) ? ? ? ? ?Problem List ?There are no problems to display for this patient. ? ? ?Sheena Marquez, PTA ?01/08/2022, 11:18 AM ? ?[place] ?[facility] ?[address] ?[place], [place], [address] ?Phone: [phone]   Fax:  [phone] ? ?Name: Sheena Marquez ?MRN: [id number] ?Date of Birth: 05-08-1951 ? ? ? ?

## 2022-01-11 ENCOUNTER — Ambulatory Visit: Payer: Medicare Other

## 2022-01-11 DIAGNOSIS — Z9181 History of falling: Secondary | ICD-10-CM

## 2022-01-11 DIAGNOSIS — M6281 Muscle weakness (generalized): Secondary | ICD-10-CM | POA: Diagnosis not present

## 2022-01-11 NOTE — Therapy (Signed)
[place] ?[facility] ?[address] ?[place], [place], [address] ?Phone: [phone]   Fax:  [phone] ? ?Physical Therapy Treatment ? ?Patient Details  ?Name: Sheena Marquez ?MRN: [id number] ?Date of Birth: 16-Dec-1950 ?Referring Provider (PT): Burcher, MD ? ? ?Encounter Date: 01/11/2022 ? ? PT End of Session - 01/11/22 1037   ? ? Visit Number 6   ? Number of Visits 10   ? Date for PT Re-Evaluation 03/23/22   ? PT Start Time 1030   ? PT Stop Time 1115   ? PT Time Calculation (min) 45 min   ? Activity Tolerance Patient tolerated treatment well   ? Behavior During Therapy [facility] for tasks assessed/performed   ? ?  ?  ? ?  ? ? ?Past Medical History:  ?Diagnosis Date  ? Anxiety   ? panic attacks  ? Asthma 5-6 yrs. ago  ? asthma attack 5-6 yrs. ago [facility] in ICU 2 weeks  ? Bulging disc   ? pt. states she has 3 bulging disc  ? COPD (chronic obstructive pulmonary disease) (HCC)   ? Diabetes mellitus without complication (HCC)   ? Fracture of coccyx (HCC) 12/2012  ? Hypertension   ? Shortness of breath   ? Sleep apnea   ? ? ?Past Surgical History:  ?Procedure Laterality Date  ? ABDOMINAL HYSTERECTOMY    ? MULTIPLE EXTRACTIONS WITH ALVEOLOPLASTY N/A 09/07/2013  ? Procedure: MULTIPLE EXTRACION WITH ALVEOLOPLASTY;  Surgeon: Georgia Lopes, DDS;  Location: [facility] OR;  Service: Oral Surgery;  Laterality: N/A;  ? SKIN CANCER EXCISION    ? head, upper lip, right hand  ? ? ?There were no vitals filed for this visit. ? ? Subjective Assessment - 01/11/22 1036   ? ? Subjective Pt arrives for today's treatment session reporting feeling really good today.  Pt said she was tired after last treatment, but felt very good.   ? Pertinent History hisotry of vertigo, lumbar surgeries   ? Limitations Sitting   ? How long can you sit comfortably? pain with upright sitting   ? Patient Stated Goals reduced fall risk, improved strength   ? Currently in Pain? No/denies   ? Pain Onset More than a month ago   ? ?  ?  ? ?   ? ? ? ? ? ? ? ? ? ? ? ? ? ? ? ? ? ? ? ? [facility] Adult PT Treatment/Exercise - 01/11/22 0001   ? ?  ? Knee/Hip Exercises: Aerobic  ? Nustep Lvl 5-6 x 15 mins   ?  ? Knee/Hip Exercises: Machines for Strengthening  ? Cybex Knee Extension 30# x 30 reps   ? Cybex Knee Flexion 50# x 30 reps   ? Cybex Leg Press 2 plates x 30 reps   ? Other Machine Cybex lumbar flexion/extension 60# x 2 mins each   ? ?  ?  ? ?  ? ? ? ? ? ? ? ? ? ? ? ? ? ? ? PT Long Term Goals - 12/26/21 0807   ? ?  ? PT LONG TERM GOAL #1  ? Title Patient will be independent with her HEP.   ? Time 5   ? Period Weeks   ? Status New   ? Target Date 01/30/22   ?  ? PT LONG TERM GOAL #2  ? Title Patient will be able to safely navigate at least 4 steps with a reciprocal pattern for improved household mobility.   ? Time  5   ? Period Weeks   ? Status New   ? Target Date 01/30/22   ?  ? PT LONG TERM GOAL #3  ? Title Patient will improve her hip flexor strength to 4/5 bilaterally for improved functional mobility.   ? Time 5   ? Period Weeks   ? Status New   ? Target Date 01/30/22   ? ?  ?  ? ?  ? ? ? ? ? ? ? ? Plan - 01/11/22 1037   ? ? Clinical Impression Statement Pt arrives for today's treatment session denying any pain.  Pt able to tolerate introduction of lumbar extension and flexion without complaint.  Pt requiring min cues for eccentric control with all cybex exercises.  Pt reported mild knee pain at completion of today's treatment as well as increased fatigue.   ? Personal Factors and Comorbidities Other;Time since onset of injury/illness/exacerbation   ? Examination-Activity Limitations Locomotion Level;Transfers;Sit;Stairs;Bathing   ? Rehab Potential Good   ? PT Frequency 2x / week   ? PT Duration Other (comment)   ? PT Treatment/Interventions ADLs/Self Care Home Management;Cryotherapy;Electrical Stimulation;Moist Heat;Neuromuscular re-education;Balance training;Therapeutic exercise;Therapeutic activities;Functional mobility training;Stair training;Gait  training;Patient/family education;Manual techniques;Vestibular   ? PT Next Visit Plan nustep, lower extremity strengthening and balance interventions   ? Consulted and Agree with Plan of Care Patient   ? ?  ?  ? ?  ? ? ?Patient will benefit from skilled therapeutic intervention in order to improve the following deficits and impairments:  Abnormal gait, Difficulty walking, Dizziness, Decreased activity tolerance, Pain, Decreased balance, Impaired sensation, Decreased strength, Decreased mobility ? ?Visit Diagnosis: ?History of falling ? ?Muscle weakness (generalized) ? ? ? ? ?Problem List ?There are no problems to display for this patient. ? ? ?Newman Pies, PTA ?01/11/2022, 11:20 AM ? ?[place] ?[facility] ?[address] ?[place], [place], [address] ?Phone: [phone]   Fax:  [phone] ? ?Name: Sheena Marquez ?MRN: [id number] ?Date of Birth: [birth year] ? ? ? ?

## 2022-01-15 ENCOUNTER — Ambulatory Visit: Payer: Medicare Other | Admitting: Physical Therapy

## 2022-01-15 DIAGNOSIS — Z9181 History of falling: Secondary | ICD-10-CM

## 2022-01-15 DIAGNOSIS — M6281 Muscle weakness (generalized): Secondary | ICD-10-CM

## 2022-01-15 NOTE — Therapy (Signed)
[place] ?[facility] ?[place] ?[place], [place], [address] ?Phone: [phone]   Fax:  [phone] ? ?Physical Therapy Treatment ? ?Patient Details  ?Name: Sheena Marquez ?MRN: [id number] ?Date of Birth: 1951-07-19 ?Referring Provider (PT): Burcher, MD ? ? ?Encounter Date: 01/15/2022 ? ? PT End of Session - 01/15/22 1038   ? ? Visit Number 7   ? Number of Visits 10   ? Date for PT Re-Evaluation 03/23/22   ? PT Start Time 1031   ? PT Stop Time 1114   ? PT Time Calculation (min) 43 min   ? Activity Tolerance Patient tolerated treatment well   ? Behavior During Therapy [facility] for tasks assessed/performed   ? ?  ?  ? ?  ? ? ?Past Medical History:  ?Diagnosis Date  ? Anxiety   ? panic attacks  ? Asthma 5-6 yrs. ago  ? asthma attack 5-6 yrs. ago [facility] in ICU 2 weeks  ? Bulging disc   ? pt. states she has 3 bulging disc  ? COPD (chronic obstructive pulmonary disease) ([place])   ? Diabetes mellitus without complication ([place])   ? Fracture of coccyx ([place]) 12/2012  ? Hypertension   ? Shortness of breath   ? Sleep apnea   ? ? ?Past Surgical History:  ?Procedure Laterality Date  ? ABDOMINAL HYSTERECTOMY    ? MULTIPLE EXTRACTIONS WITH ALVEOLOPLASTY N/A 09/07/2013  ? Procedure: MULTIPLE EXTRACION WITH ALVEOLOPLASTY;  Surgeon: Gae Bon, DDS;  Location: [facility];  Service: Oral Surgery;  Laterality: N/A;  ? SKIN CANCER EXCISION    ? head, upper lip, right hand  ? ? ?There were no vitals filed for this visit. ? ? Subjective Assessment - 01/15/22 1037   ? ? Subjective Doing well.  Balanace improving.   ? Pertinent History hisotry of vertigo, lumbar surgeries   ? Limitations Sitting   ? How long can you sit comfortably? pain with upright sitting   ? Patient Stated Goals reduced fall risk, improved strength   ? Pain Onset More than a month ago   ? ?  ?  ? ?  ? ? ? ? ? ? ? ? ? ? ? ? ? ? ? ? ? ? ? ? [place] Adult PT Treatment/Exercise - 01/15/22 0001   ? ?  ? Exercises  ? Exercises Knee/Hip   ?  ?  Knee/Hip Exercises: Aerobic  ? Nustep Level 6 x 17 minutes.   ?  ? Knee/Hip Exercises: Machines for Strengthening  ? Cybex Knee Extension 10# x 3 minutes.   ? Cybex Knee Flexion 30# x 3 minutes.   ? Cybex Leg Press 2 plates x 3 minutes.   ? ?  ?  ? ?  ? ? ? ? ? ? Balance Exercises - 01/15/22 0001   ? ?  ? Balance Exercises: Standing  ? Rockerboard --   Rockerboard in parallel bars forward/backward x 4 minutes and side by side x 4 minutes.  ? Other Standing Exercises Inverted Bosu in parallel bars x 4 minutes.   ? ?  ?  ? ?  ? ? ? ? ? ? ? ? ? ? PT Long Term Goals - 12/26/21 0807   ? ?  ? PT LONG TERM GOAL #1  ? Title Patient will be independent with her HEP.   ? Time 5   ? Period Weeks   ? Status New   ? Target Date 01/30/22   ?  ? PT  LONG TERM GOAL #2  ? Title Patient will be able to safely navigate at least 4 steps with a reciprocal pattern for improved household mobility.   ? Time 5   ? Period Weeks   ? Status New   ? Target Date 01/30/22   ?  ? PT LONG TERM GOAL #3  ? Title Patient will improve her hip flexor strength to 4/5 bilaterally for improved functional mobility.   ? Time 5   ? Period Weeks   ? Status New   ? Target Date 01/30/22   ? ?  ?  ? ?  ? ? ? ? ? ? ? ? Plan - 01/15/22 1122   ? ? Clinical Impression Statement The patient is highly motivated and did very well with the addition of balanace activities.   ? Personal Factors and Comorbidities Other;Time since onset of injury/illness/exacerbation   ? Examination-Activity Limitations Locomotion Level;Transfers;Sit;Stairs;Bathing   ? Examination-Participation Restrictions Cleaning;Yard Work;Church   ? Stability/Clinical Decision Making Evolving/Moderate complexity   ? Rehab Potential Good   ? PT Frequency 2x / week   ? PT Treatment/Interventions ADLs/Self Care Home Management;Cryotherapy;Electrical Stimulation;Moist Heat;Neuromuscular re-education;Balance training;Therapeutic exercise;Therapeutic activities;Functional mobility training;Stair training;Gait  training;Patient/family education;Manual techniques;Vestibular   ? PT Next Visit Plan nustep, lower extremity strengthening and balance interventions   ? Consulted and Agree with Plan of Care Patient   ? ?  ?  ? ?  ? ? ?Patient will benefit from skilled therapeutic intervention in order to improve the following deficits and impairments:  Abnormal gait, Difficulty walking, Dizziness, Decreased activity tolerance, Pain, Decreased balance, Impaired sensation, Decreased strength, Decreased mobility ? ?Visit Diagnosis: ?History of falling ? ?Muscle weakness (generalized) ? ? ? ? ?Problem List ?There are no problems to display for this patient. ? ? ?Joshva Labreck, [place], PT ?01/15/2022, 11:24 AM ? ?[place] ?[facility] ?[place] ?[place], [place], [address] ?Phone: [phone]   Fax:  [phone] ? ?Name: Sheena Marquez ?MRN: [id number] ?Date of Birth: [birth year] ? ? ? ?

## 2022-01-18 ENCOUNTER — Ambulatory Visit: Payer: Medicare Other

## 2022-01-18 DIAGNOSIS — M6281 Muscle weakness (generalized): Secondary | ICD-10-CM | POA: Diagnosis not present

## 2022-01-18 DIAGNOSIS — Z9181 History of falling: Secondary | ICD-10-CM

## 2022-01-18 NOTE — Therapy (Signed)
[place] ?[facility] ?[address] ?[place], [place], [address] ?Phone: [phone]   Fax:  [phone] ? ?Physical Therapy Treatment ? ?Patient Details  ?Name: Sheena Marquez ?MRN: [id number] ?Date of Birth: 1951-02-24 ?Referring Provider (PT): Burcher, MD ? ? ?Encounter Date: 01/18/2022 ? ? PT End of Session - 01/18/22 1038   ? ? Visit Number 8   ? Number of Visits 10   ? Date for PT Re-Evaluation 03/23/22   ? PT Start Time 1030   ? PT Stop Time 1120   ? PT Time Calculation (min) 50 min   ? Activity Tolerance Patient tolerated treatment well   ? Behavior During Therapy [facility] for tasks assessed/performed   ? ?  ?  ? ?  ? ? ?Past Medical History:  ?Diagnosis Date  ? Anxiety   ? panic attacks  ? Asthma 5-6 yrs. ago  ? asthma attack 5-6 yrs. ago [facility] in ICU 2 weeks  ? Bulging disc   ? pt. states she has 3 bulging disc  ? COPD (chronic obstructive pulmonary disease) ([facility])   ? Diabetes mellitus without complication ([facility])   ? Fracture of coccyx ([facility]) 12/2012  ? Hypertension   ? Shortness of breath   ? Sleep apnea   ? ? ?Past Surgical History:  ?Procedure Laterality Date  ? ABDOMINAL HYSTERECTOMY    ? MULTIPLE EXTRACTIONS WITH ALVEOLOPLASTY N/A 09/07/2013  ? Procedure: MULTIPLE EXTRACION WITH ALVEOLOPLASTY;  Surgeon: Georgia Lopes, DDS;  Location[facility];  Service: Oral Surgery;  Laterality: N/A;  ? SKIN CANCER EXCISION    ? head, upper lip, right hand  ? ? ?There were no vitals filed for this visit. ? ? Subjective Assessment - 01/18/22 1036   ? ? Subjective Pt arrives for today's treatment session reporting 8/10 low back pain.   ? Pertinent History hisotry of vertigo, lumbar surgeries   ? Limitations Sitting   ? How long can you sit comfortably? pain with upright sitting   ? Patient Stated Goals reduced fall risk, improved strength   ? Currently in Pain? Yes   ? Pain Score 8    ? Pain Location Back   ? Pain Orientation Right;Lower   ? Pain Onset More than a month ago   ? ?  ?  ? ?   ? ? ? ? ? ? ? ? ? ? ? ? ? ? ? ? ? ? ? ? [facility] Adult PT Treatment/Exercise - 01/18/22 0001   ? ?  ? Knee/Hip Exercises: Aerobic  ? Recumbent Bike Lvl 4 x 15 mins   ?  ? Knee/Hip Exercises: Machines for Strengthening  ? Cybex Knee Extension 10# x 4 minutes.   ? Cybex Knee Flexion 40# x 3 minutes.   ? Cybex Leg Press 2 plates x 4 minutes.   ? ?  ?  ? ?  ? ? ? ? ? ? Balance Exercises - 01/18/22 0001   ? ?  ? Balance Exercises: Standing  ? Tandem Gait Forward;3 reps;Upper extremity support;Foam/compliant surface   Single UE support  ? Sidestepping Foam/compliant support;3 reps   2 finger support  ? Other Standing Exercises Inverted BOSU x 2 mins intermittent BUE support   ? Other Standing Exercises Comments Functional Quats on inverted BOSU x 20 reps single finger support   ? ?  ?  ? ?  ? ? ? ? ? ? ? ? ? ? PT Long Term Goals - 12/26/21 0807   ? ?  ?  PT LONG TERM GOAL #1  ? Title Patient will be independent with her HEP.   ? Time 5   ? Period Weeks   ? Status New   ? Target Date 01/30/22   ?  ? PT LONG TERM GOAL #2  ? Title Patient will be able to safely navigate at least 4 steps with a reciprocal pattern for improved household mobility.   ? Time 5   ? Period Weeks   ? Status New   ? Target Date 01/30/22   ?  ? PT LONG TERM GOAL #3  ? Title Patient will improve her hip flexor strength to 4/5 bilaterally for improved functional mobility.   ? Time 5   ? Period Weeks   ? Status New   ? Target Date 01/30/22   ? ?  ?  ? ?  ? ? ? ? ? ? ? ? Plan - 01/18/22 1038   ? ? Clinical Impression Statement Pt arrives for today's treatment session reporting 5/10 low back pain.  Pt introduced to recumbent bike for warm-up to increase endurance and activity tolerance requiring pillow behind her due to short stature.  Pt able to tolerate increased weight on cybex flexion machine as well as increased time with cybex extension and leg press.  Pt continue to be challenged by BOSU ball balance activities, but is pleased with her progress.  Pt  denied any pain at completion of today's treatment session.   ? Personal Factors and Comorbidities Other;Time since onset of injury/illness/exacerbation   ? Examination-Activity Limitations Locomotion Level;Transfers;Sit;Stairs;Bathing   ? Examination-Participation Restrictions Cleaning;Yard Work;Church   ? Stability/Clinical Decision Making Evolving/Moderate complexity   ? Rehab Potential Good   ? PT Frequency 2x / week   ? PT Treatment/Interventions ADLs/Self Care Home Management;Cryotherapy;Electrical Stimulation;Moist Heat;Neuromuscular re-education;Balance training;Therapeutic exercise;Therapeutic activities;Functional mobility training;Stair training;Gait training;Patient/family education;Manual techniques;Vestibular   ? PT Next Visit Plan nustep, lower extremity strengthening and balance interventions   ? Consulted and Agree with Plan of Care Patient   ? ?  ?  ? ?  ? ? ?Patient will benefit from skilled therapeutic intervention in order to improve the following deficits and impairments:  Abnormal gait, Difficulty walking, Dizziness, Decreased activity tolerance, Pain, Decreased balance, Impaired sensation, Decreased strength, Decreased mobility ? ?Visit Diagnosis: ?History of falling ? ?Muscle weakness (generalized) ? ? ? ? ?Problem List ?There are no problems to display for this patient. ? ? ?Newman Pies, PTA ?01/18/2022, 11:28 AM ? ?[place] ?[facility] ?[address] ?[place], [place], [address] ?Phone: [phone]   Fax:  [phone] ? ?Name: Sheena Marquez ?MRN: [id number] ?Date of Birth: 29-Sep-1950 ? ? ? ?

## 2022-01-22 ENCOUNTER — Encounter: Payer: Self-pay | Admitting: Physical Therapy

## 2022-01-22 ENCOUNTER — Ambulatory Visit: Payer: Medicare Other | Attending: Internal Medicine | Admitting: Physical Therapy

## 2022-01-22 DIAGNOSIS — Z9181 History of falling: Secondary | ICD-10-CM

## 2022-01-22 DIAGNOSIS — W19XXXA Unspecified fall, initial encounter: Secondary | ICD-10-CM | POA: Insufficient documentation

## 2022-01-22 DIAGNOSIS — M6281 Muscle weakness (generalized): Secondary | ICD-10-CM | POA: Insufficient documentation

## 2022-01-22 NOTE — Therapy (Signed)
[place] ?[facility] ?[address] ?[place], [place], [address] ?Phone: [phone]   Fax:  [phone] ? ?Physical Therapy Treatment ? ?Patient Details  ?Name: Sheena Marquez ?MRN: [id number] ?Date of Birth: [birth year] ?Referring Provider (PT): Burcher, MD ? ? ?Encounter Date: 01/22/2022 ? ? PT End of Session - 01/22/22 1120   ? ? Visit Number 9   ? Number of Visits 10   ? Date for PT Re-Evaluation 03/23/22   ? PT Start Time 1032   ? PT Stop Time 1114   ? PT Time Calculation (min) 42 min   ? Activity Tolerance Patient tolerated treatment well   ? Behavior During Therapy [facility] for tasks assessed/performed   ? ?  ?  ? ?  ? ? ?Past Medical History:  ?Diagnosis Date  ? Anxiety   ? panic attacks  ? Asthma 5-6 yrs. ago  ? asthma attack 5-6 yrs. ago [facility] in ICU 2 weeks  ? Bulging disc   ? pt. states she has 3 bulging disc  ? COPD (chronic obstructive pulmonary disease) ([facility])   ? Diabetes mellitus without complication ([facility])   ? Fracture of coccyx ([facility]) 12/2012  ? Hypertension   ? Shortness of breath   ? Sleep apnea   ? ? ?Past Surgical History:  ?Procedure Laterality Date  ? ABDOMINAL HYSTERECTOMY    ? MULTIPLE EXTRACTIONS WITH ALVEOLOPLASTY N/A 09/07/2013  ? Procedure: MULTIPLE EXTRACION WITH ALVEOLOPLASTY;  Surgeon: Georgia Lopes, DDS;  Location: [facility] OR;  Service: Oral Surgery;  Laterality: N/A;  ? SKIN CANCER EXCISION    ? head, upper lip, right hand  ? ? ?There were no vitals filed for this visit. ? ? Subjective Assessment - 01/22/22 1108   ? ? Subjective Doing great.  Joined a gym and will start next week when I'm done with PT.   ? Pertinent History hisotry of vertigo, lumbar surgeries   ? Limitations Sitting   ? How long can you sit comfortably? pain with upright sitting   ? Patient Stated Goals reduced fall risk, improved strength   ? Currently in Pain? Yes   ? Pain Score 3    ? Pain Location Back   ? Pain Orientation Right;Lower   ? Pain Descriptors / Indicators Sharp;Sore   ? Pain  Type Acute pain   ? Pain Onset More than a month ago   ? ?  ?  ? ?  ? ? ? ? ? ? ? ? ? ? ? ? ? ? ? ? ? ? ? ? [facility] Adult PT Treatment/Exercise - 01/22/22 0001   ? ?  ? Exercises  ? Exercises Knee/Hip   ?  ? Knee/Hip Exercises: Aerobic  ? Recumbent Bike Level 4 x 17 minutes.   ?  ? Knee/Hip Exercises: Machines for Strengthening  ? Cybex Knee Extension 10# x 3 minutes.   ? Cybex Knee Flexion 30# x 3 minutes.   ? Cybex Leg Press 2 plates x 3 minutes.   ? ?  ?  ? ?  ? ? ? ? ? ? Balance Exercises - 01/22/22 0001   ? ?  ? Balance Exercises: Standing  ? Other Standing Exercises BOSU ball x 4 and inverted BOSU x 4 minutes in parallel bars   ? Other Standing Exercises Comments Rockerboard x 4 minutes   ? ?  ?  ? ?  ? ? ? ? ? ? ? ? ? ? PT Long Term Goals - 12/26/21 0807   ? ?  ?  PT LONG TERM GOAL #1  ? Title Patient will be independent with her HEP.   ? Time 5   ? Period Weeks   ? Status New   ? Target Date 01/30/22   ?  ? PT LONG TERM GOAL #2  ? Title Patient will be able to safely navigate at least 4 steps with a reciprocal pattern for improved household mobility.   ? Time 5   ? Period Weeks   ? Status New   ? Target Date 01/30/22   ?  ? PT LONG TERM GOAL #3  ? Title Patient will improve her hip flexor strength to 4/5 bilaterally for improved functional mobility.   ? Time 5   ? Period Weeks   ? Status New   ? Target Date 01/30/22   ? ?  ?  ? ?  ? ? ? ? ? ? ? ? Plan - 01/22/22 1149   ? ? Clinical Impression Statement Excellent response to treatment.  Patient very pleased with her progress.  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  Discharge from PT next visit.   ? Personal Factors and Comorbidities Other;Time since onset of injury/illness/exacerbation   ? Examination-Activity Limitations Locomotion Level;Transfers;Sit;Stairs;Bathing   ? Examination-Participation Restrictions Cleaning;Yard Work;Church   ? Stability/Clinical Decision Making Evolving/Moderate complexity   ? Rehab Potential Good   ? PT  Frequency 2x / week   ? PT Treatment/Interventions ADLs/Self Care Home Management;Cryotherapy;Electrical Stimulation;Moist Heat;Neuromuscular re-education;Balance training;Therapeutic exercise;Therapeutic activities;Functional mobility training;Stair training;Gait training;Patient/family education;Manual techniques;Vestibular   ? PT Next Visit Plan D/c next visit.   ? Consulted and Agree with Plan of Care Patient   ? ?  ?  ? ?  ? ? ?Patient will benefit from skilled therapeutic intervention in order to improve the following deficits and impairments:    ? ?Visit Diagnosis: ?History of falling ? ?Muscle weakness (generalized) ? ? ? ? ?Problem List ?There are no problems to display for this patient. ? ? ?Aira Sallade, [place], PT ?01/22/2022, 11:51 AM ? ?[place] ?[facility] ?[address] ?[place], [place], [address] ?Phone: [phone]   Fax:  [phone] ? ?Name: BONETTA MOSTEK ?MRN: [id number] ?Date of Birth: 1951/01/11 ? ? ? ?

## 2022-01-25 ENCOUNTER — Ambulatory Visit: Payer: Medicare Other

## 2022-01-25 DIAGNOSIS — Z9181 History of falling: Secondary | ICD-10-CM

## 2022-01-25 DIAGNOSIS — M6281 Muscle weakness (generalized): Secondary | ICD-10-CM | POA: Diagnosis not present

## 2022-01-25 NOTE — Therapy (Addendum)
[place] ?[facility] ?[address] ?[place], [place], [address] ?Phone: [phone]   Fax:  [phone] ? ?Physical Therapy Treatment ? ?Patient Details  ?Name: Sheena Marquez ?MRN: [id number] ?Date of Birth: [birth year] ?Referring Provider (PT): Burcher, MD ? ? ?Encounter Date: 01/25/2022 ? ? PT End of Session - 01/25/22 1051   ? ? Visit Number 10   ? Number of Visits 10   ? Date for PT Re-Evaluation 03/23/22   ? PT Start Time 0352   ? PT Stop Time 1120   ? PT Time Calculation (min) 41 min   ? Activity Tolerance Patient tolerated treatment well   ? Behavior During Therapy [facility] for tasks assessed/performed   ? ?  ?  ? ?  ? ? ?Past Medical History:  ?Diagnosis Date  ? Anxiety   ? panic attacks  ? Asthma 5-6 yrs. ago  ? asthma attack 5-6 yrs. ago [facility] in ICU 2 weeks  ? Bulging disc   ? pt. states she has 3 bulging disc  ? COPD (chronic obstructive pulmonary disease) ([place])   ? Diabetes mellitus without complication ([place])   ? Fracture of coccyx ([place]) 12/2012  ? Hypertension   ? Shortness of breath   ? Sleep apnea   ? ? ?Past Surgical History:  ?Procedure Laterality Date  ? ABDOMINAL HYSTERECTOMY    ? MULTIPLE EXTRACTIONS WITH ALVEOLOPLASTY N/A 09/07/2013  ? Procedure: MULTIPLE EXTRACION WITH ALVEOLOPLASTY;  Surgeon: Gae Bon, DDS;  Location: [facility];  Service: Oral Surgery;  Laterality: N/A;  ? SKIN CANCER EXCISION    ? head, upper lip, right hand  ? ? ?There were no vitals filed for this visit. ? ? Subjective Assessment - 01/25/22 1050   ? ? Subjective Pt arrives for today's treamtent session denying any pain.  Pt states that she has 8/10 left IT band pain when waking  up.   ? Pertinent History hisotry of vertigo, lumbar surgeries   ? Limitations Sitting   ? How long can you sit comfortably? pain with upright sitting   ? Patient Stated Goals reduced fall risk, improved strength   ? Currently in Pain? No/denies   ? Pain Onset More than a month ago   ? ?  ?  ? ?   ? ? ? ? ? ? ? ? ? ? ? ? ? ? ? ? ? ? ? ? [facility] Adult PT Treatment/Exercise - 01/25/22 0001   ? ?  ? Knee/Hip Exercises: Aerobic  ? Recumbent Bike Lvl 5 x 15 mins   ?  ? Knee/Hip Exercises: Machines for Strengthening  ? Cybex Knee Extension 20# 3 sets of 10   ? Cybex Knee Flexion 40# 3 sets of 10   ? Cybex Leg Press 3 plates 3 sets of 10   ?  ? Manual Therapy  ? Manual Therapy Soft tissue mobilization   ? Soft tissue mobilization STW/M to left IT band to decrease pain and tone   ? ?  ?  ? ?  ? ? ? ? ? ? ? ? ? ? ? ? ? ? ? PT Long Term Goals - 01/25/22 1052   ? ?  ? PT LONG TERM GOAL #1  ? Title Patient will be independent with her HEP.   ? Time 5   ? Period Weeks   ? Status Achieved   ? Target Date 01/30/22   ?  ? PT LONG TERM GOAL #2  ? Title Patient will  be able to safely navigate at least 4 steps with a reciprocal pattern for improved household mobility.   ? Time 5   ? Period Weeks   ? Status Achieved   ? Target Date 01/30/22   ?  ? PT LONG TERM GOAL #3  ? Title Patient will improve her hip flexor strength to 4/5 bilaterally for improved functional mobility.   ? Baseline 01/25/22: 4+/5   ? Time 5   ? Period Weeks   ? Status New   ? Target Date 01/30/22   ? ?  ?  ? ?  ? ? ? ? ? ? ? ? Plan - 01/25/22 1052   ? ? Clinical Impression Statement Pt arrives for today's treatment session denying any pain, but states that she had 8/10 left IT band this morning when she got out of bed.  Pt able to tolerate increased weight with all cybex machines today.  Pt requiring increased cues for eccentric control, but able to perform all reps requested of her.  STW/M performed to left IT band to decrease pain and tone, noteable tone.  Pt has met all goals at this time and is ready to discharge.  Pt encouraged to call facility with any questions or concerns.   ? Personal Factors and Comorbidities Other;Time since onset of injury/illness/exacerbation   ? Examination-Activity Limitations Locomotion Level;Transfers;Sit;Stairs;Bathing   ?  Examination-Participation Restrictions Cleaning;Yard Work;Church   ? Stability/Clinical Decision Making Evolving/Moderate complexity   ? Rehab Potential Good   ? PT Frequency 2x / week   ? PT Treatment/Interventions ADLs/Self Care Home Management;Cryotherapy;Electrical Stimulation;Moist Heat;Neuromuscular re-education;Balance training;Therapeutic exercise;Therapeutic activities;Functional mobility training;Stair training;Gait training;Patient/family education;Manual techniques;Vestibular   ? PT Next Visit Plan D/c next visit.   ? Consulted and Agree with Plan of Care Patient   ? ?  ?  ? ?  ? ? ?Patient will benefit from skilled therapeutic intervention in order to improve the following deficits and impairments:    ? ?Visit Diagnosis: ?History of falling ? ?Muscle weakness (generalized) ? ? ? ? ?Problem List ?There are no problems to display for this patient. ? ? ?Kathrynn Ducking, PTA ?01/25/2022, 11:24 AM ? ?[place] ?[facility] ?[address] ?[place], [place], [address] ?Phone: [phone]   Fax:  [phone] ? ?Name: Sheena Marquez ?MRN: [id number] ?Date of Birth: [birth year] ? ?PHYSICAL THERAPY DISCHARGE SUMMARY ? ?Visits from Start of Care: 10 ? ?Current functional level related to goals / functional outcomes: ?Patient was able to meet all of her goals for physical therapy and feels comfortable being discharged at this time.  ?  ?Remaining deficits: ?Left IT band pain ?  ?Education / Equipment: ?HEP   ? ?Patient agrees to discharge. Patient goals were met. Patient is being discharged due to meeting the stated rehab goals. ? ?Jacqulynn Cadet, PT, DPT   ? ?

## 2022-02-10 ENCOUNTER — Encounter (HOSPITAL_COMMUNITY): Payer: Self-pay | Admitting: Emergency Medicine

## 2022-02-10 ENCOUNTER — Emergency Department (HOSPITAL_COMMUNITY): Payer: Medicare Other

## 2022-02-10 ENCOUNTER — Other Ambulatory Visit: Payer: Self-pay

## 2022-02-10 ENCOUNTER — Emergency Department (HOSPITAL_COMMUNITY)
Admission: EM | Admit: 2022-02-10 | Discharge: 2022-02-10 | Disposition: A | Discharge status: Home / Self Care | Payer: Medicare Other | Attending: Emergency Medicine | Admitting: Emergency Medicine

## 2022-02-10 DIAGNOSIS — Y92007 Garden or yard of unspecified non-institutional (private) residence as the place of occurrence of the external cause: Secondary | ICD-10-CM | POA: Insufficient documentation

## 2022-02-10 DIAGNOSIS — S59902A Unspecified injury of left elbow, initial encounter: Secondary | ICD-10-CM | POA: Diagnosis present

## 2022-02-10 DIAGNOSIS — Z7984 Long term (current) use of oral hypoglycemic drugs: Secondary | ICD-10-CM | POA: Diagnosis not present

## 2022-02-10 DIAGNOSIS — W01118A Fall on same level from slipping, tripping and stumbling with subsequent striking against other sharp object, initial encounter: Secondary | ICD-10-CM | POA: Insufficient documentation

## 2022-02-10 DIAGNOSIS — S51012A Laceration without foreign body of left elbow, initial encounter: Secondary | ICD-10-CM

## 2022-02-10 DIAGNOSIS — Z79899 Other long term (current) drug therapy: Secondary | ICD-10-CM | POA: Insufficient documentation

## 2022-02-10 DIAGNOSIS — Z7982 Long term (current) use of aspirin: Secondary | ICD-10-CM | POA: Diagnosis not present

## 2022-02-10 MED ORDER — LIDOCAINE-EPINEPHRINE 2 %-1:200000 IJ SOLN
20.0000 mL | Freq: Once | INTRAMUSCULAR | Status: AC
Start: 2022-02-10 — End: 2022-02-10
  Administered 2022-02-10: 20 mL
  Filled 2022-02-10: qty 20

## 2022-02-10 NOTE — ED Provider Notes (Signed)
[facility] Provider Note   CSN: [id number] Arrival date & time: 02/10/22  1744     History Chief Complaint  Patient presents with   Lytle Michaels    Sheena Marquez is a 71 y.o. female who presents to the emergency department with left elbow pain and a skin tear after a trip and fall over a garden hose that occurred just prior to arrival.  Patient was seen evaluated urgent care and had the wound soaked and irrigated extensively.  The wound was wrapped and she was sent here for imaging.  She denies hitting her head or losing consciousness.   Fall      Home Medications Prior to Admission medications   Medication Sig Start Date End Date Taking? Authorizing Provider  albuterol (PROVENTIL HFA;VENTOLIN HFA) 108 (90 BASE) MCG/ACT inhaler Inhale 2 puffs into the lungs every 6 (six) hours as needed for wheezing or shortness of breath.    [provider]  amLODipine (NORVASC) 10 MG tablet Take 10 mg by mouth daily.    [provider]  aspirin EC 81 MG tablet Take 81 mg by mouth daily.    [provider]  clindamycin (CLEOCIN) 300 MG capsule Take 1 capsule (300 mg total) by mouth 3 (three) times daily. Patient not taking: Reported on 05/02/2015 09/07/13   Diona Browner, DMD  gabapentin (NEURONTIN) 600 MG tablet Take 600-1,200 mg by mouth 3 (three) times daily. 600 mg every morning and midday, then 1200 mg at night    [provider]  hydrochlorothiazide (HYDRODIURIL) 25 MG tablet Take 25 mg by mouth daily. Patient not taking: Reported on 12/26/2021    [provider]  insulin glargine (LANTUS) 100 UNIT/ML injection Inject 78 Units into the skin at bedtime. Patient not taking: Reported on 12/26/2021    [provider]  ketoconazole (NIZORAL) 2 % cream Apply 1 application topically daily as needed for irritation. Applied to face Patient not taking: Reported on 12/26/2021    [provider]  losartan (COZAAR) 50 MG tablet  Take 50 mg by mouth daily. Patient not taking: Reported on 12/26/2021    [provider]  metFORMIN (GLUCOPHAGE) 1000 MG tablet Take 1,000 mg by mouth 2 (two) times daily with a meal.    [provider]  oxyCODONE-acetaminophen (PERCOCET) 5-325 MG per tablet Take 1-2 tablets by mouth every 4 (four) hours as needed for severe pain. Patient not taking: Reported on 05/02/2015 09/07/13   Diona Browner, DMD  PARoxetine (PAXIL) 20 MG tablet Take 20 mg by mouth daily. Patient not taking: Reported on 12/26/2021    [provider]  pravastatin (PRAVACHOL) 40 MG tablet Take 40 mg by mouth daily.    [provider]  tiotropium (SPIRIVA) 18 MCG inhalation capsule Place 18 mcg into inhaler and inhale daily.    [provider]      Allergies    Codeine, Lactose intolerance (gi), and Penicillins    Review of Systems   Review of Systems  All other systems reviewed and are negative.  Physical Exam Updated Vital Signs BP (!) 160/80 (BP Location: Right Arm)   Pulse 70   Temp 98.3 F (36.8 C) (Oral)   Resp 18   Ht 5\' 1"  (1.549 m)   Wt 70.9 kg   SpO2 99%   BMI 29.53 kg/m  Physical Exam Vitals and nursing note reviewed.  Constitutional:      Appearance: Normal appearance.  HENT:     Head: Normocephalic  and atraumatic.  Eyes:     General:        Right eye: No discharge.        Left eye: No discharge.     Conjunctiva/sclera: Conjunctivae normal.  Pulmonary:     Effort: Pulmonary effort is normal.  Skin:    General: Skin is warm and dry.     Findings: No rash.     Comments: Large deep skin tear with overlying Y-shaped deep laceration.  Neurological:     General: No focal deficit present.     Mental Status: She is alert.  Psychiatric:        Mood and Affect: Mood normal.        Behavior: Behavior normal.       ED Results / Procedures / Treatments   Labs (all labs ordered are listed, but only abnormal results are displayed) Labs Reviewed - No  data to display  EKG None  Radiology DG Elbow Complete Left  Result Date: 02/10/2022 CLINICAL DATA:  Fall. EXAM: LEFT ELBOW - COMPLETE 3+ VIEW COMPARISON:  None Available. FINDINGS: There is soft tissue laceration overlying the posterior olecranon. There are numerous punctate radiopaque densities in the region of the laceration compatible with foreign bodies. There is no acute fracture or dislocation. Joint spaces are maintained. No joint effusion. Olecranon spur is present. IMPRESSION: 1. Soft tissue laceration overlying the posterior olecranon with numerous punctate foreign bodies. 2. No acute bony abnormality. Electronically Signed   By: Ronney Asters M.D.   On: 02/10/2022 19:42    Procedures .Marland KitchenLaceration Repair  Date/Time: 02/10/2022 9:26 PM Performed by: Hendricks Limes, PA-C Authorized by: Hendricks Limes, PA-C   Consent:    Consent obtained:  Verbal   Consent given by:  Patient   Risks, benefits, and alternatives were discussed: yes     Risks discussed:  Infection, pain and poor cosmetic result Universal protocol:    Procedure explained and questions answered to patient or proxy's satisfaction: yes     Relevant documents present and verified: yes     Test results available: yes     Imaging studies available: yes     Required blood products, implants, devices, and special equipment available: no     Site/side marked: no     Immediately prior to procedure, a time out was called: no     Patient identity confirmed:  Verbally with patient and arm band Anesthesia:    Anesthesia method:  Local infiltration   Local anesthetic:  Lidocaine 2% WITH epi Laceration details:    Location:  Shoulder/arm   Shoulder/arm location:  L elbow   Length (cm):  12   Depth (mm):  10 Pre-procedure details:    Preparation:  Patient was prepped and draped in usual sterile fashion Exploration:    Hemostasis achieved with:  Epinephrine and direct pressure   Imaging obtained: x-ray     Imaging  outcome: foreign body noted     Wound exploration: wound explored through full range of motion and entire depth of wound visualized     Wound extent: areolar tissue violated, fascia violated and foreign bodies/material     Wound extent: no muscle damage noted, no nerve damage noted, no tendon damage noted, no underlying fracture noted and no vascular damage noted     Foreign bodies/material:  Gravel Treatment:    Area cleansed with:  Povidone-iodine   Amount of cleaning:  Extensive   Irrigation solution:  Sterile saline   Irrigation volume:  1L   Irrigation method:  Pressure wash   Visualized foreign bodies/material removed: yes     Debridement:  None   Undermining:  None Skin repair:    Repair method:  Sutures   Suture size:  4-0   Suture material:  Nylon   Number of sutures:  8 Approximation:    Approximation:  Close Repair type:    Repair type:  Complex Post-procedure details:    Dressing:  Non-adherent dressing   Procedure completion:  Tolerated well, no immediate complications    Medications Ordered in ED Medications  lidocaine-EPINEPHrine (XYLOCAINE W/EPI) 2 %-1:200000 (PF) injection 20 mL (20 mLs Infiltration Given 02/10/22 2016)    ED Course/ Medical Decision Making/ A&P                           Medical Decision Making JONAE COBLE is a 71 y.o. female who presents to the emergency department today for further evaluation of a left elbow laceration.  I ordered and interpreted imaging of the left elbow which did not show any signs of obvious fractures.  There was evidence of gravel on imaging.  I thoroughly or irrigated and examined the elbow and removed all of the pieces of gravel.  Please see procedure note above, I closed the wound.  I discussed appropriate wound care with the patient.  Strict return precautions were discussed with the patient.  I will have her return to the emergency department in 7 to 10 days for suture removal.  She is safe for  discharge.   Amount and/or Complexity of Data Reviewed Radiology: ordered.  Risk Prescription drug management.   Final Clinical Impression(s) / ED Diagnoses Final diagnoses:  Laceration of left elbow, initial encounter    Rx / DC Orders ED Discharge Orders     None         Cherrie Gauze 02/10/22 2145    Daleen Bo, MD 02/10/22 2333

## 2022-02-10 NOTE — ED Triage Notes (Signed)
Patient c/o elbow pain and skin tear to left elbow after tripping and falling over hose outside. Per patient landed on gravel and dirt. Patient went to Urgent Care, wound soaked and dressed with gauze and coban and patient sent here to ED for X-ray. Dressing intact. Denies hitting head or LOC. Denies taking any type of anticoagulant. Patient does have neck pain from fall as well.

## 2022-02-10 NOTE — ED Notes (Signed)
ED Provider at bedside. 

## 2022-02-10 NOTE — Discharge Instructions (Signed)
Please keep area clean and dry.  Please cover the area if you are to be working outside.  Please return to the emergency department or your primary care provider in 7 to 10 days for suture removal.  Please return to the emergency room sooner for any signs of infection.

## 2023-09-12 IMAGING — DX DG ELBOW COMPLETE 3+V*L*
4 series · 4 of 4 positions shown · non-contrast
Comparison: None Available.

CLINICAL DATA: Fall.

EXAM:
LEFT ELBOW - COMPLETE 3+ VIEW

[elbow ap]
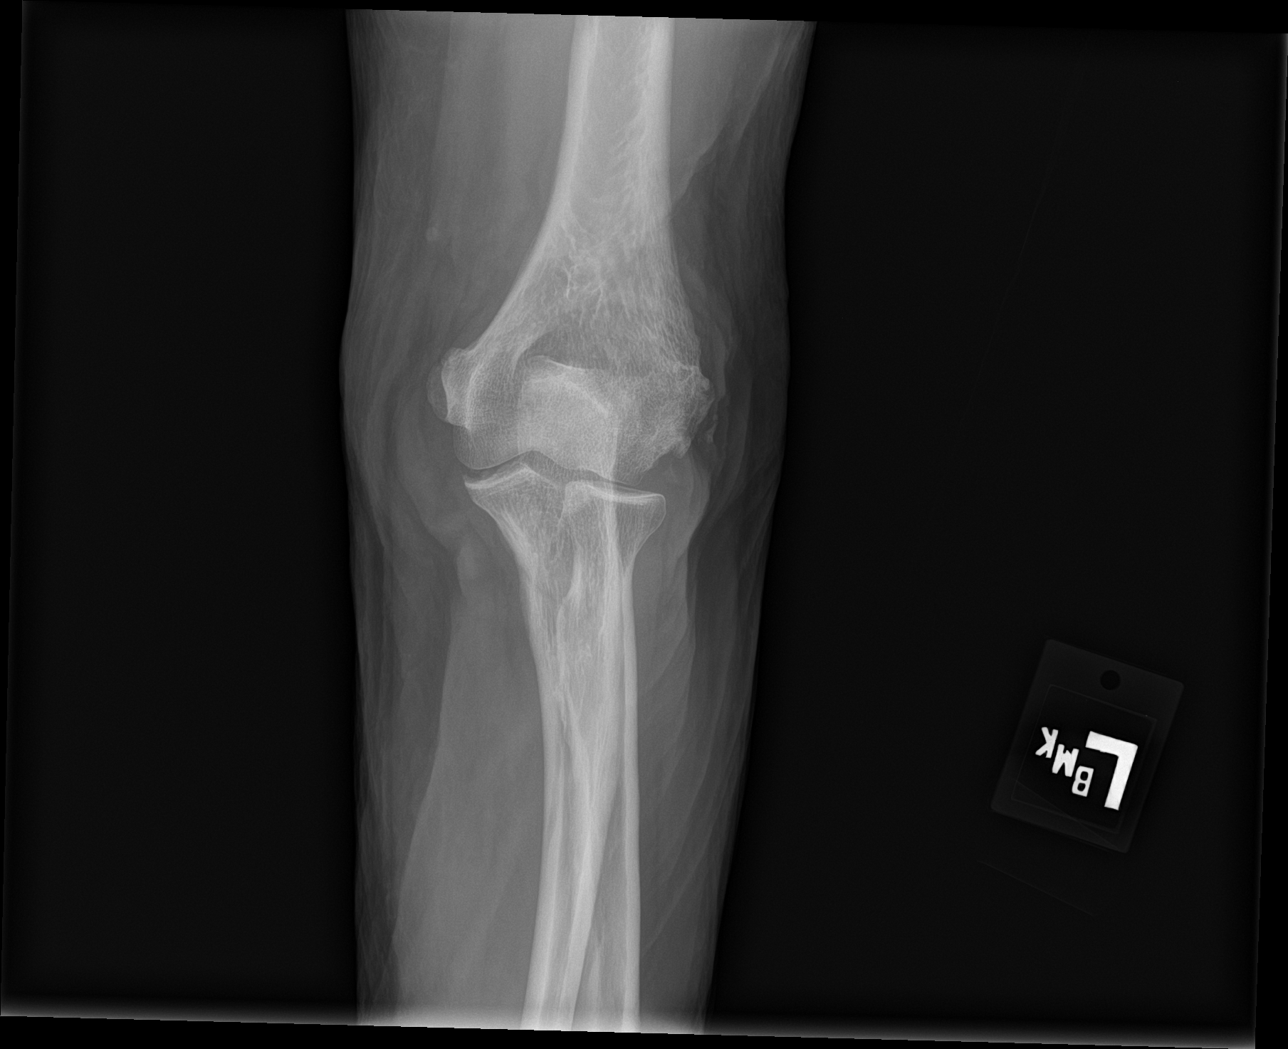

[elbow obl (1 of 2)]
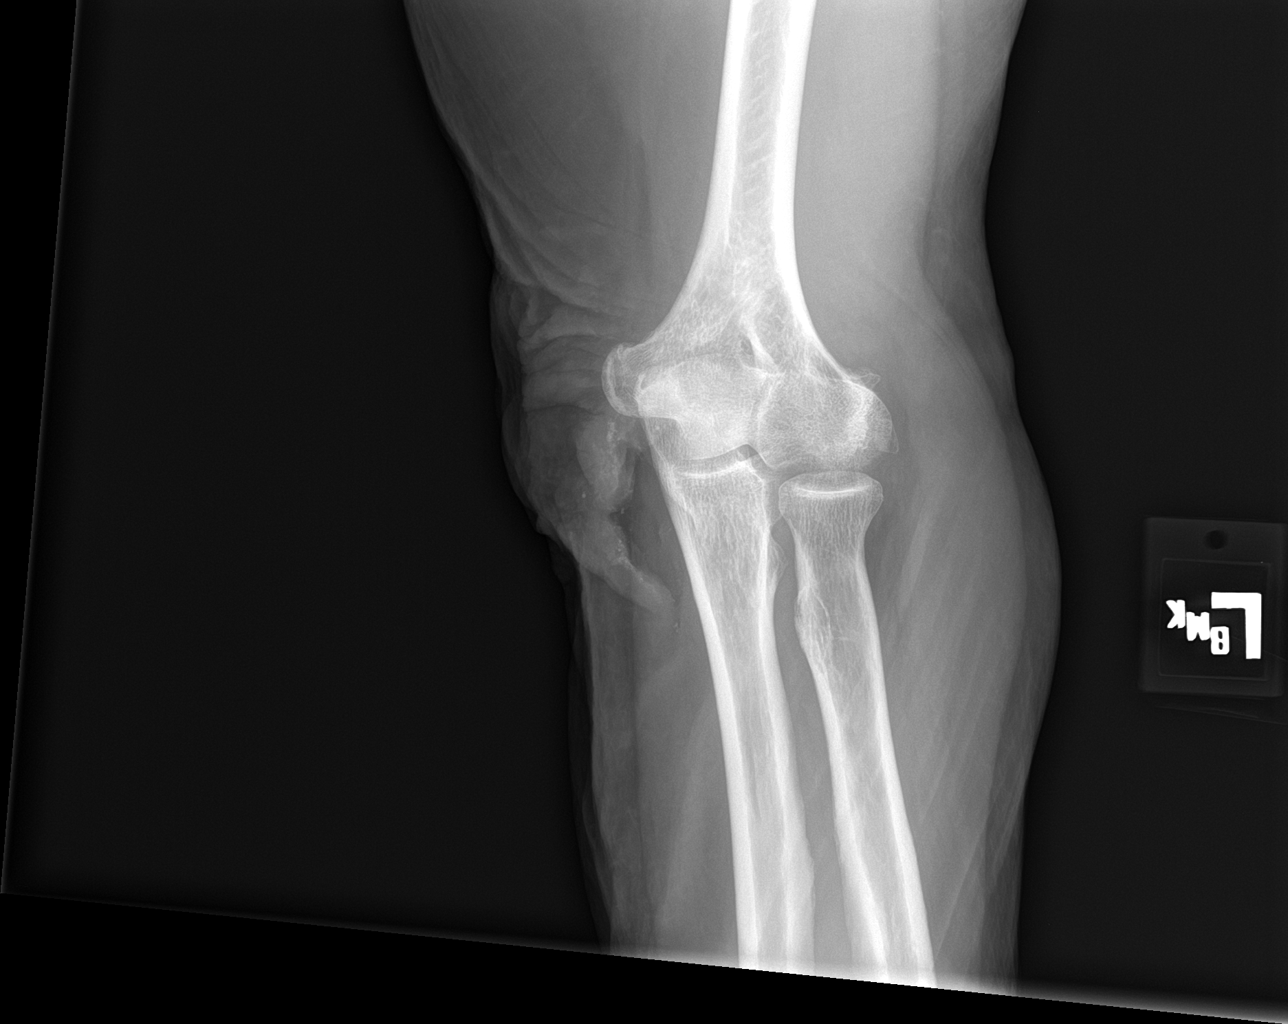

[elbow lat]
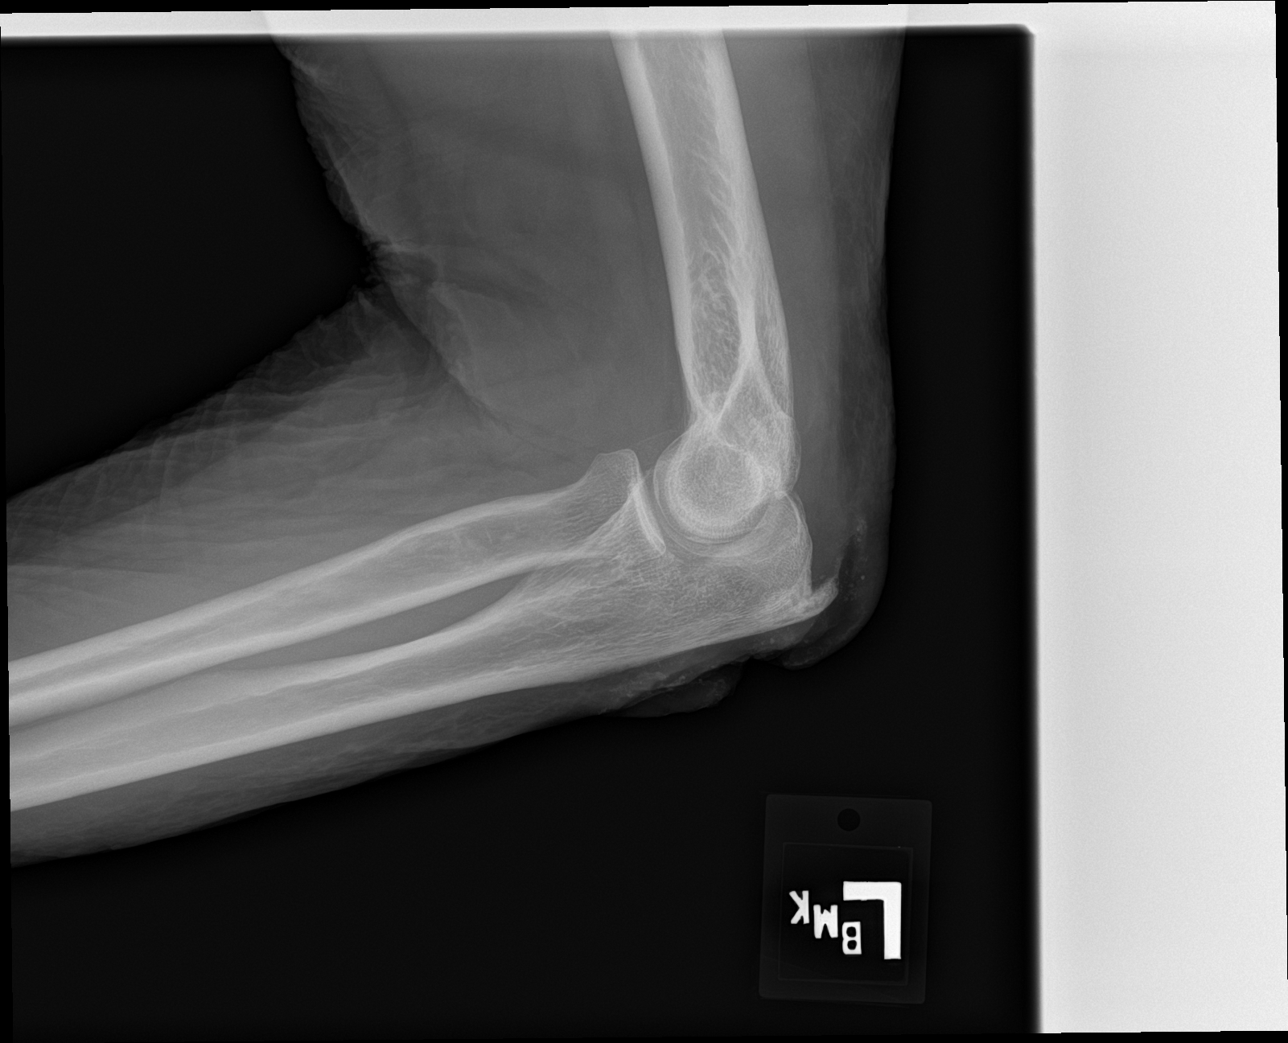

[elbow obl (2 of 2)]
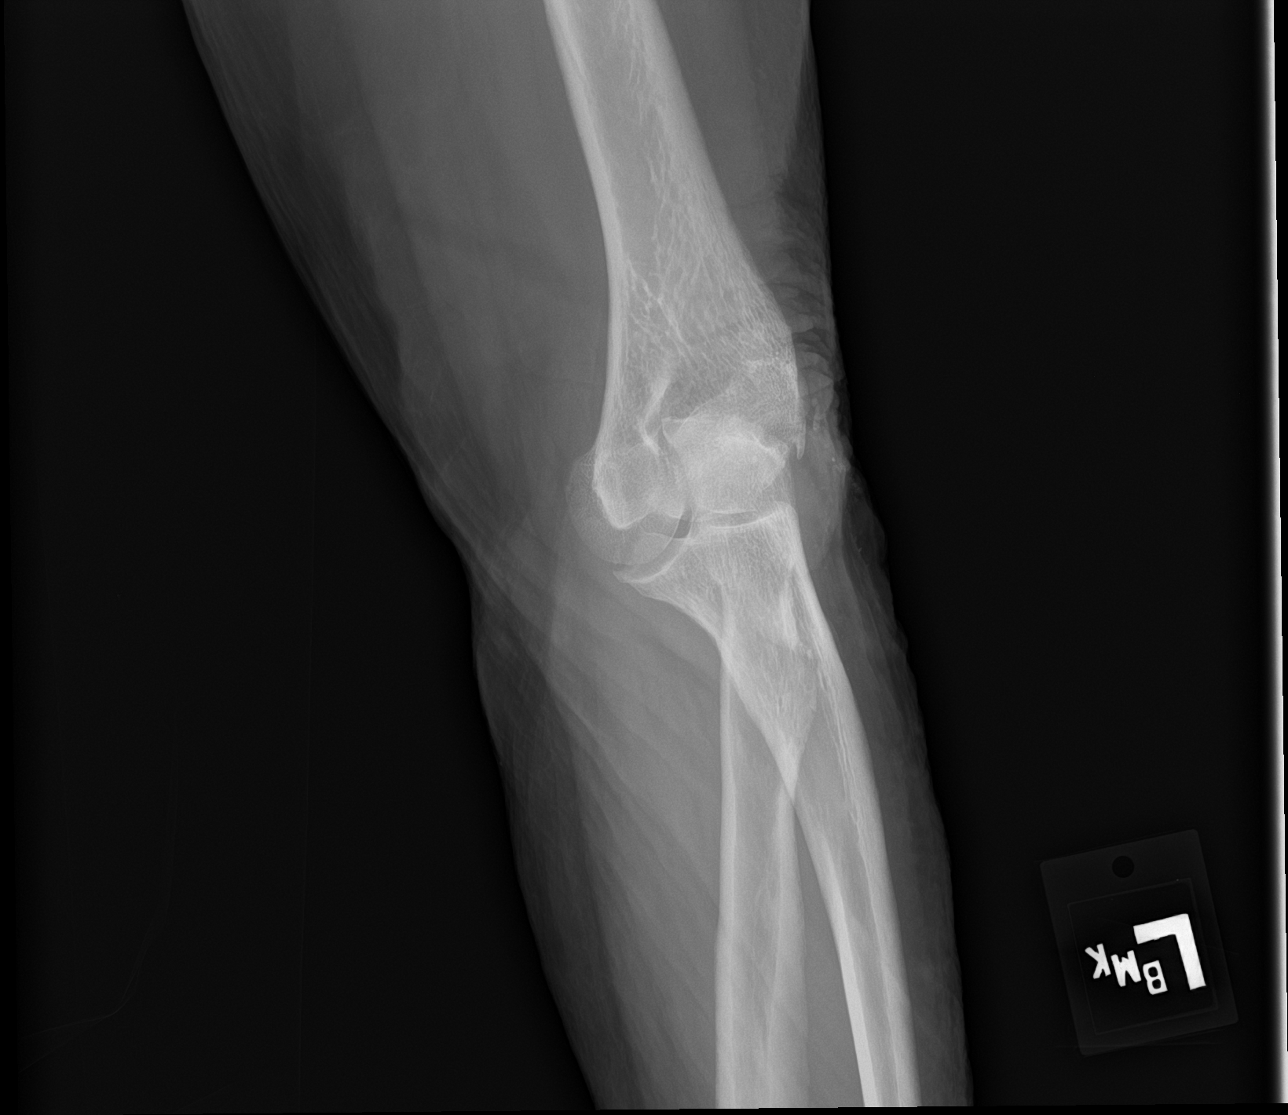

[4 of 4 positions shown; findings below may reference images not displayed]

FINDINGS: There is soft tissue laceration overlying the posterior olecranon.
There are numerous punctate radiopaque densities in the region of
the laceration compatible with foreign bodies. There is no acute
fracture or dislocation. Joint spaces are maintained. No joint
effusion. Olecranon spur is present.
IMPRESSION: 1. Soft tissue laceration overlying the posterior olecranon with
numerous punctate foreign bodies.
2. No acute bony abnormality.
